# Patient Record
Sex: Male | Born: 1990 | Race: White | Hispanic: No | Marital: Single | Smoking: Current every day smoker
Health system: Southern US, Community
[De-identification: ages and names within clinical notes are randomized; demographics above are authoritative.]

## PROBLEM LIST (undated history)

## (undated) DIAGNOSIS — F32A Depression, unspecified: Secondary | ICD-10-CM

## (undated) DIAGNOSIS — T7840XA Allergy, unspecified, initial encounter: Secondary | ICD-10-CM

## (undated) DIAGNOSIS — F419 Anxiety disorder, unspecified: Secondary | ICD-10-CM

## (undated) DIAGNOSIS — F329 Major depressive disorder, single episode, unspecified: Secondary | ICD-10-CM

## (undated) HISTORY — DX: Anxiety disorder, unspecified: F41.9

## (undated) HISTORY — DX: Depression, unspecified: F32.A

## (undated) HISTORY — DX: Allergy, unspecified, initial encounter: T78.40XA

## (undated) HISTORY — DX: Major depressive disorder, single episode, unspecified: F32.9

---
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
## 2012-02-29 ENCOUNTER — Emergency Department: Payer: Self-pay | Admitting: Emergency Medicine

## 2012-02-29 LAB — BASIC METABOLIC PANEL
Anion Gap: 10 (ref 7–16)
BUN: 16 mg/dL (ref 9–21)
Creatinine: 0.88 mg/dL (ref 0.60–1.30)
EGFR (African American): >60
EGFR (Non-African Amer.): >60
Sodium: 140 mmol/L (ref 132–141)

## 2012-02-29 LAB — URINALYSIS, COMPLETE
Bacteria: NONE SEEN
Bilirubin,UR: NEGATIVE
Blood: NEGATIVE
Glucose,UR: NEGATIVE mg/dL (ref 0–75)
Ketone: NEGATIVE
Leukocyte Esterase: NEGATIVE
Specific Gravity: 1.03 (ref 1.003–1.030)
Squamous Epithelial: NONE SEEN

## 2012-02-29 LAB — CBC
HGB: 15.3 g/dL (ref 13.0–18.0)
MCH: 31.5 pg (ref 26.0–34.0)
MCHC: 34.4 g/dL (ref 32.0–36.0)
Platelet: 237 10*3/uL (ref 150–440)
RBC: 4.86 10*6/uL (ref 4.40–5.90)
RDW: 13.8 % (ref 11.5–14.5)
WBC: 6.9 10*3/uL (ref 3.8–10.6)

## 2012-05-01 ENCOUNTER — Ambulatory Visit: Payer: Self-pay

## 2012-11-15 ENCOUNTER — Emergency Department: Payer: Self-pay | Admitting: Emergency Medicine

## 2014-11-13 ENCOUNTER — Emergency Department: Payer: Self-pay | Admitting: Internal Medicine

## 2014-11-16 ENCOUNTER — Emergency Department: Payer: Self-pay | Admitting: Emergency Medicine

## 2014-11-16 LAB — COMPREHENSIVE METABOLIC PANEL
ALBUMIN: 4.1 g/dL (ref 3.4–5.0)
Alkaline Phosphatase: 112 U/L
Anion Gap: 6 — ABNORMAL LOW (ref 7–16)
BILIRUBIN TOTAL: 0.7 mg/dL (ref 0.2–1.0)
BUN: 7 mg/dL (ref 7–18)
CHLORIDE: 108 mmol/L — AB (ref 98–107)
CO2: 27 mmol/L (ref 21–32)
CREATININE: 0.99 mg/dL (ref 0.60–1.30)
Calcium, Total: 8.6 mg/dL (ref 8.5–10.1)
EGFR (Non-African Amer.): >60
GLUCOSE: 106 mg/dL — AB (ref 65–99)
OSMOLALITY: 280 (ref 275–301)
Potassium: 3.9 mmol/L (ref 3.5–5.1)
SGOT(AST): 14 U/L — ABNORMAL LOW (ref 15–37)
SGPT (ALT): 18 U/L
Sodium: 141 mmol/L (ref 136–145)
Total Protein: 7.6 g/dL (ref 6.4–8.2)

## 2014-11-16 LAB — URINALYSIS, COMPLETE
BACTERIA: NONE SEEN
BLOOD: NEGATIVE
Bilirubin,UR: NEGATIVE
GLUCOSE, UR: NEGATIVE mg/dL (ref 0–75)
Ketone: NEGATIVE
NITRITE: NEGATIVE
PROTEIN: NEGATIVE
Ph: 5 (ref 4.5–8.0)
RBC,UR: 6 /HPF (ref 0–5)
SPECIFIC GRAVITY: 1.016 (ref 1.003–1.030)
Squamous Epithelial: NONE SEEN
WBC UR: 48 /HPF (ref 0–5)

## 2014-11-16 LAB — CBC WITH DIFFERENTIAL/PLATELET
Basophil #: 0.1 10*3/uL (ref 0.0–0.1)
Basophil %: 0.7 %
EOS ABS: 0.3 10*3/uL (ref 0.0–0.7)
EOS PCT: 2.9 %
HCT: 46.2 % (ref 40.0–52.0)
HGB: 15.6 g/dL (ref 13.0–18.0)
LYMPHS PCT: 24 %
Lymphocyte #: 2.2 10*3/uL (ref 1.0–3.6)
MCH: 30.4 pg (ref 26.0–34.0)
MCHC: 33.8 g/dL (ref 32.0–36.0)
MCV: 90 fL (ref 80–100)
MONO ABS: 0.8 x10 3/mm (ref 0.2–1.0)
MONOS PCT: 8.5 %
NEUTROS ABS: 5.8 10*3/uL (ref 1.4–6.5)
NEUTROS PCT: 63.9 %
PLATELETS: 240 10*3/uL (ref 150–440)
RBC: 5.14 10*6/uL (ref 4.40–5.90)
RDW: 13.3 % (ref 11.5–14.5)
WBC: 9.1 10*3/uL (ref 3.8–10.6)

## 2014-11-18 ENCOUNTER — Emergency Department: Payer: Self-pay | Admitting: Emergency Medicine

## 2016-01-03 ENCOUNTER — Emergency Department
Admission: EM | Admit: 2016-01-03 | Discharge: 2016-01-03 | Discharge status: Against Medical Advice | Payer: Medicaid Other | Attending: Emergency Medicine | Admitting: Emergency Medicine

## 2016-01-03 ENCOUNTER — Encounter: Payer: Self-pay | Admitting: Emergency Medicine

## 2016-01-03 DIAGNOSIS — F17200 Nicotine dependence, unspecified, uncomplicated: Secondary | ICD-10-CM | POA: Insufficient documentation

## 2016-01-03 DIAGNOSIS — R0981 Nasal congestion: Secondary | ICD-10-CM | POA: Insufficient documentation

## 2016-01-03 DIAGNOSIS — R51 Headache: Secondary | ICD-10-CM | POA: Insufficient documentation

## 2016-01-03 DIAGNOSIS — R05 Cough: Secondary | ICD-10-CM | POA: Insufficient documentation

## 2016-01-03 DIAGNOSIS — R110 Nausea: Secondary | ICD-10-CM | POA: Insufficient documentation

## 2016-01-03 NOTE — ED Notes (Signed)
Pt to ed with c/o cough, congestion and headache since yesterday.  Pt coughing at triage.

## 2016-01-16 ENCOUNTER — Emergency Department
Admission: EM | Admit: 2016-01-16 | Discharge: 2016-01-16 | Disposition: A | Discharge status: Home / Self Care | Payer: Self-pay | Attending: Emergency Medicine | Admitting: Emergency Medicine

## 2016-01-16 ENCOUNTER — Encounter: Payer: Self-pay | Admitting: Emergency Medicine

## 2016-01-16 ENCOUNTER — Emergency Department: Payer: Medicaid Other

## 2016-01-16 DIAGNOSIS — R0789 Other chest pain: Secondary | ICD-10-CM | POA: Insufficient documentation

## 2016-01-16 DIAGNOSIS — F419 Anxiety disorder, unspecified: Secondary | ICD-10-CM | POA: Insufficient documentation

## 2016-01-16 DIAGNOSIS — F17210 Nicotine dependence, cigarettes, uncomplicated: Secondary | ICD-10-CM | POA: Insufficient documentation

## 2016-01-16 LAB — BASIC METABOLIC PANEL
ANION GAP: 8 (ref 5–15)
BUN: 10 mg/dL (ref 6–20)
CHLORIDE: 106 mmol/L (ref 101–111)
CO2: 25 mmol/L (ref 22–32)
Calcium: 9.3 mg/dL (ref 8.9–10.3)
Creatinine, Ser: 0.91 mg/dL (ref 0.61–1.24)
GFR calc non Af Amer: >60 mL/min (ref >60)
Glucose, Bld: 96 mg/dL (ref 65–99)
POTASSIUM: 3.6 mmol/L (ref 3.5–5.1)
Sodium: 139 mmol/L (ref 135–145)

## 2016-01-16 LAB — CBC
HEMATOCRIT: 44.8 % (ref 40.0–52.0)
HEMOGLOBIN: 15.6 g/dL (ref 13.0–18.0)
MCH: 30.4 pg (ref 26.0–34.0)
MCHC: 34.8 g/dL (ref 32.0–36.0)
MCV: 87.3 fL (ref 80.0–100.0)
PLATELETS: 240 10*3/uL (ref 150–440)
RBC: 5.13 MIL/uL (ref 4.40–5.90)
RDW: 13.1 % (ref 11.5–14.5)
WBC: 9 10*3/uL (ref 3.8–10.6)

## 2016-01-16 LAB — TROPONIN I: Troponin I: <0.03 ng/mL (ref <0.031)

## 2016-01-16 NOTE — ED Provider Notes (Signed)
[facility] Emergency Department Provider Note  ____________________________________________  Time seen: 1440  I have reviewed the triage vital signs and the nursing notes.  History by:  Patient along with his family.  HISTORY  Chief Complaint Chest Pain and Cough     HPI Brett Rice is a 25 y.o. male who reports she has focal pain in his left lower chest. This is been present for a proximally 4 days. He says it is intermittent but there is no pattern to when the pain strikes. He does report he is able to sleep at night and when he awakens is not too bad but then worsens at around noon. He does have some pain with movement. He reports a feeling of tightness across his lower chest as well. He also reports that he feels like the muscles in his legs and arms spasm and her tight at times.  Patient does report he has had similar symptoms before and he thinks she was here in 2015. He reports he has a history of anxiety. Apparently his mother thinks that anxiety is either the cause or contributes to this problem. The patient does not have a counselor or psychiatrist at this time.   History reviewed. No pertinent past medical history.  There are no active problems to display for this patient.   History reviewed. No pertinent past surgical history.  No current outpatient prescriptions on file.  Allergies Review of patient's allergies indicates no known allergies.  History reviewed. No pertinent family history.  Social History Social History  Substance Use Topics  . Smoking status: Current Every Day Smoker -- 2.00 packs/day    Types: Cigarettes  . Smokeless tobacco: None  . Alcohol Use: Yes    Review of Systems  Constitutional: Negative for fever/chills. ENT: Negative for congestion. Cardiovascular: Positive for left-sided chest pain. See history of present illness Respiratory: Negative for cough. Gastrointestinal: Negative for abdominal pain,  vomiting and diarrhea. Genitourinary: Negative for dysuria. Musculoskeletal: Patient reports general muscle tightness and some mild spasms in arms and legs. Skin: Negative for rash. Neurological: Negative for headache or focal weakness Psychiatric: History of anxiety.  10-point ROS otherwise negative.  ____________________________________________   PHYSICAL EXAM:  VITAL SIGNS: ED Triage Vitals  Enc Vitals Group     BP 01/16/16 1352 126/80 mmHg     Pulse Rate 01/16/16 1352 91     Resp 01/16/16 1352 18     Temp 01/16/16 1352 98.1 F (36.7 C)     Temp Source 01/16/16 1352 Oral     SpO2 01/16/16 1352 100 %     Weight 01/16/16 1352 165 lb (74.844 kg)     Height 01/16/16 1352  (1.778 m)     Head Cir --      Peak Flow --      Pain Score 01/16/16 1352 5     Pain Loc --      Pain Edu? --      Excl. in GC? --     Constitutional: Alert and oriented. Well appearing and in no distress. ENT   Head: Normocephalic and atraumatic.   Nose: No congestion/rhinnorhea.  Cardiovascular: Normal rate, regular rhythm, no murmur noted Chest wall: Focal tenderness in the left lower ribs. Respiratory:  Normal respiratory effort, no tachypnea.    Breath sounds are clear and equal bilaterally.  Gastrointestinal: Soft, no distention. Nontender Back: No muscle spasm, no tenderness, no CVA tenderness. Musculoskeletal: No deformity noted. Nontender with normal range of  motion in all extremities.  No noted edema. Neurologic:  Communicative. Normal appearing spontaneous movement in all 4 extremities. No gross focal neurologic deficits are appreciated.  Skin:  Skin is warm, dry. No rash noted. Psychiatric: Mood and affect are normal. Speech and behavior are normal. Patient does report a history and a problem with anxiety. ____________________________________________    LABS (pertinent positives/negatives)  Labs Reviewed  BASIC METABOLIC PANEL  CBC  TROPONIN I      ____________________________________________   EKG  ED ECG REPORT I, Le Ferraz W, the attending physician, personally viewed and interpreted this ECG.   Date: 01/16/2016  EKG Time: 1347  Rate: 80  Rhythm:  Normal sinus rhythm  Axis: Normal  Intervals: Normal  ST&T Change: None noted   ____________________________________________    RADIOLOGY  Chest x-ray: Mild hyper inflation. No infiltrate or acute findings.  ____________________________________________   PROCEDURES    ____________________________________________   INITIAL IMPRESSION / ASSESSMENT AND PLAN / ED COURSE  Pertinent labs & imaging results that were available during my care of the patient were reviewed by me and considered in my medical decision making (see chart for details).  25 year old male without history of cardiac disease or restaurant problems, with focal tenderness in his left chest wall.  I do not see any benefit of stronger medications for him, including muscle relaxants. The family member in the room with the patient agrees. I will avoid benzodiazepines currently for this patient. I have suggested that he take 600 mg of ibuprofen 3 times a day and to supplement with magnesium to possibly ease some of the muscle tension. I've asked him to follow-up at [facility] for his anxiety problem and I am referring him to an outpatient physician for further evaluation of this focal chest wall tenderness.  ____________________________________________   FINAL CLINICAL IMPRESSION(S) / ED DIAGNOSES  Final diagnoses:  Chest wall pain      Darien Ramus, MD 01/16/16 1530

## 2016-01-16 NOTE — Discharge Instructions (Signed)
Take ibuprofen, 600-800 mg 3 times a day. You may supplement with magnesium, take 250-500 mg of magnesium before bed. Increased her water and decrease amount of caffeine you taken. Follow-up at [facility] for anxiety issues and at [facility] clinic for any ongoing medical issues. Return to the emergency department if you have worsening chest pain, shortness of breath or other urgent concerns.  Chest Wall Pain Chest wall pain is pain in or around the bones and muscles of your chest. Sometimes, an injury causes this pain. Sometimes, the cause may not be known. This pain may take several weeks or longer to get better. HOME CARE INSTRUCTIONS  Pay attention to any changes in your symptoms. Take these actions to help with your pain:   Rest as told by your health care provider.   Avoid activities that cause pain. These include any activities that use your chest muscles or your abdominal and side muscles to lift heavy items.   If directed, apply ice to the painful area:  Put ice in a plastic bag.  Place a towel between your skin and the bag.  Leave the ice on for 20 minutes, 2-3 times per day.  Take over-the-counter and prescription medicines only as told by your health care provider.  Do not use tobacco products, including cigarettes, chewing tobacco, and e-cigarettes. If you need help quitting, ask your health care provider.  Keep all follow-up visits as told by your health care provider. This is important. SEEK MEDICAL CARE IF:  You have a fever.  Your chest pain becomes worse.  You have new symptoms. SEEK IMMEDIATE MEDICAL CARE IF:  You have nausea or vomiting.  You feel sweaty or light-headed.  You have a cough with phlegm (sputum) or you cough up blood.  You develop shortness of breath.   This information is not intended to replace advice given to you by your health care provider. Make sure you discuss any questions you have with your health care provider.   Document Released:  12/11/2005 Document Revised: 09/01/2015 Document Reviewed: 03/08/2015 Elsevier Interactive Patient Education [employer]

## 2016-01-16 NOTE — ED Notes (Signed)
Pt c/o intermittent chest pain for the last 4 days. Pt states hx of anxiety. States pain is in the L side of his chest and "sometimes" goes to his arm. NAD Noted at this time. Pt alert and oriented and ambulatory in triage.

## 2016-02-02 ENCOUNTER — Emergency Department
Admission: EM | Admit: 2016-02-02 | Discharge: 2016-02-02 | Disposition: A | Discharge status: Home / Self Care | Payer: Medicaid Other | Attending: Emergency Medicine | Admitting: Emergency Medicine

## 2016-02-02 DIAGNOSIS — R112 Nausea with vomiting, unspecified: Secondary | ICD-10-CM

## 2016-02-02 DIAGNOSIS — R1012 Left upper quadrant pain: Secondary | ICD-10-CM | POA: Insufficient documentation

## 2016-02-02 DIAGNOSIS — F17210 Nicotine dependence, cigarettes, uncomplicated: Secondary | ICD-10-CM | POA: Insufficient documentation

## 2016-02-02 DIAGNOSIS — R197 Diarrhea, unspecified: Secondary | ICD-10-CM | POA: Insufficient documentation

## 2016-02-02 LAB — URINALYSIS COMPLETE WITH MICROSCOPIC (ARMC ONLY)
BACTERIA UA: NONE SEEN
Bilirubin Urine: NEGATIVE
GLUCOSE, UA: NEGATIVE mg/dL
HGB URINE DIPSTICK: NEGATIVE
Ketones, ur: NEGATIVE mg/dL
LEUKOCYTES UA: NEGATIVE
Nitrite: NEGATIVE
PH: 6 (ref 5.0–8.0)
Protein, ur: NEGATIVE mg/dL
RBC / HPF: NONE SEEN RBC/hpf (ref 0–5)
SQUAMOUS EPITHELIAL / LPF: NONE SEEN
Specific Gravity, Urine: 1.015 (ref 1.005–1.030)

## 2016-02-02 LAB — COMPREHENSIVE METABOLIC PANEL
ALK PHOS: 82 U/L (ref 38–126)
ALT: 17 U/L (ref 17–63)
ANION GAP: 8 (ref 5–15)
AST: 17 U/L (ref 15–41)
Albumin: 4.4 g/dL (ref 3.5–5.0)
BILIRUBIN TOTAL: 0.7 mg/dL (ref 0.3–1.2)
BUN: 13 mg/dL (ref 6–20)
CALCIUM: 9.2 mg/dL (ref 8.9–10.3)
CO2: 24 mmol/L (ref 22–32)
CREATININE: 0.91 mg/dL (ref 0.61–1.24)
Chloride: 107 mmol/L (ref 101–111)
Glucose, Bld: 136 mg/dL — ABNORMAL HIGH (ref 65–99)
Potassium: 3.5 mmol/L (ref 3.5–5.1)
Sodium: 139 mmol/L (ref 135–145)
TOTAL PROTEIN: 7 g/dL (ref 6.5–8.1)

## 2016-02-02 LAB — CBC
HCT: 40.3 % (ref 40.0–52.0)
HEMOGLOBIN: 14.2 g/dL (ref 13.0–18.0)
MCH: 30.9 pg (ref 26.0–34.0)
MCHC: 35.3 g/dL (ref 32.0–36.0)
MCV: 87.7 fL (ref 80.0–100.0)
Platelets: 220 10*3/uL (ref 150–440)
RBC: 4.6 MIL/uL (ref 4.40–5.90)
RDW: 12.9 % (ref 11.5–14.5)
WBC: 9.5 10*3/uL (ref 3.8–10.6)

## 2016-02-02 LAB — LIPASE, BLOOD: Lipase: 22 U/L (ref 11–51)

## 2016-02-02 MED ORDER — FAMOTIDINE 40 MG PO TABS: 40.0000 mg | ORAL_TABLET | Freq: Every evening | ORAL | Status: DC

## 2016-02-02 MED ORDER — METOCLOPRAMIDE HCL 10 MG PO TABS: 10.0000 mg | ORAL_TABLET | Freq: Four times a day (QID) | ORAL | Status: DC | PRN

## 2016-02-02 NOTE — ED Notes (Signed)
Pt in with co n.v.d since today, also co abd pain.

## 2016-02-02 NOTE — ED Provider Notes (Signed)
[facility] Emergency Department Provider Note  ____________________________________________  Time seen: Approximately 9 PM  I have reviewed the triage vital signs and the nursing notes.   HISTORY  Chief Complaint Emesis    HPI Brett Rice is a 25 y.o. male presenting to the emergency department with nausea vomiting and diarrhea. He says that the symptoms started just today. However, he says he has been having some general malaise over the past week with night sweats. He has a known sick contact of one of his coworkers last week. He says his symptoms have improved and he is now very hungry. He says that he also has cramping abdominal pain in the left upper quadrant of his abdomen. He also has some mild body aches. No report of fever. No burning with urination. Says that he has had 5-6 episode of nausea vomiting and diarrhea. Did not notice any blood in his vomitus or stool.   No past medical history on file.  There are no active problems to display for this patient.   No past surgical history on file.  No current outpatient prescriptions on file.  Allergies Review of patient's allergies indicates no known allergies.  No family history on file.  Social History Social History  Substance Use Topics  . Smoking status: Current Every Day Smoker -- 2.00 packs/day    Types: Cigarettes  . Smokeless tobacco: Not on file  . Alcohol Use: Yes    Review of Systems Constitutional: No fever/chills Eyes: No visual changes. ENT: No sore throat. Cardiovascular: Denies chest pain. Respiratory: Denies shortness of breath. Gastrointestinal:  No constipation. Genitourinary: Negative for dysuria. Musculoskeletal: Negative for back pain. Skin: Negative for rash. Neurological: Negative for headaches, focal weakness or numbness.  10-point ROS otherwise negative.  ____________________________________________   PHYSICAL EXAM:  VITAL SIGNS: ED Triage Vitals   Enc Vitals Group     BP 02/02/16 1932 114/72 mmHg     Pulse Rate 02/02/16 1932 87     Resp 02/02/16 1932 18     Temp 02/02/16 1932 98.5 F (36.9 C)     Temp Source 02/02/16 1932 Oral     SpO2 02/02/16 1932 97 %     Weight 02/02/16 1932 165 lb (74.844 kg)     Height 02/02/16 1932  (1.753 m)     Head Cir --      Peak Flow --      Pain Score 02/02/16 1932 8     Pain Loc --      Pain Edu? --      Excl. in GC? --    Constitutional: Alert and oriented. Well appearing and in no acute distress. Eyes: Conjunctivae are normal. PERRL. EOMI. Head: Atraumatic. Nose: No congestion/rhinnorhea. Mouth/Throat: Mucous membranes are moist.  Oropharynx non-erythematous. Neck: No stridor.   Cardiovascular: Normal rate, regular rhythm. Grossly normal heart sounds.  Good peripheral circulation. Respiratory: Normal respiratory effort.  No retractions. Lungs CTAB. Gastrointestinal: Soft with mild left upper quadrant tenderness palpation. There is no rebound or guarding. No distention.  No CVA tenderness. Musculoskeletal: No lower extremity tenderness nor edema.  No joint effusions. Neurologic:  Normal speech and language. No gross focal neurologic deficits are appreciated. No gait instability. Skin:  Skin is warm, dry and intact. No rash noted. Psychiatric: Mood and affect are normal. Speech and behavior are normal.  ____________________________________________   LABS (all labs ordered are listed, but only abnormal results are displayed)  Labs Reviewed  COMPREHENSIVE METABOLIC PANEL -  Abnormal; Notable for the following:    Glucose, Bld 136 (*)    All other components within normal limits  URINALYSIS COMPLETEWITH MICROSCOPIC ([facility] ONLY) - Abnormal; Notable for the following:    Color, Urine YELLOW (*)    APPearance CLEAR (*)    All other components within normal limits  LIPASE, BLOOD  CBC    ____________________________________________  EKG   ____________________________________________  RADIOLOGY   ____________________________________________   PROCEDURES    ____________________________________________   INITIAL IMPRESSION / ASSESSMENT AND PLAN / ED COURSE  Pertinent labs & imaging results that were available during my care of the patient were reviewed by me and considered in my medical decision making (see chart for details).  Patient with very benign abdominal exam. Now wanting to eat. We'll discharge with antacid as well as Reglan. Has follow-up information for the [place] [facility]. He does not have insurance. We'll be prescribing medicines off the $4 list. Patient also requesting work note. Likely viral etiology with gastritis. Known sick contacts. No right lower quadrant tenderness palpation or rigidity. Malaise and night sweats likely secondary to viral illness. ____________________________________________   FINAL CLINICAL IMPRESSION(S) / ED DIAGNOSES  Left upper quadrant abdominal pain. Nausea vomiting and diarrhea.    Myrna Blazer, MD 02/02/16 2120

## 2016-02-02 NOTE — Discharge Instructions (Signed)
Abdominal Pain, Adult Many things can cause belly (abdominal) pain. Most times, the belly pain is not dangerous. Many cases of belly pain can be watched and treated at home. HOME CARE   Do not take medicines that help you go poop (laxatives) unless told to by your doctor.  Only take medicine as told by your doctor.  Eat or drink as told by your doctor. Your doctor will tell you if you should be on a special diet. GET HELP IF:  You do not know what is causing your belly pain.  You have belly pain while you are sick to your stomach (nauseous) or have runny poop (diarrhea).  You have pain while you pee or poop.  Your belly pain wakes you up at night.  You have belly pain that gets worse or better when you eat.  You have belly pain that gets worse when you eat fatty foods.  You have a fever. GET HELP RIGHT AWAY IF:   The pain does not go away within 2 hours.  You keep throwing up (vomiting).  The pain changes and is only in the right or left part of the belly.  You have bloody or tarry looking poop. MAKE SURE YOU:   Understand these instructions.  Will watch your condition.  Will get help right away if you are not doing well or get worse.   This information is not intended to replace advice given to you by your health care provider. Make sure you discuss any questions you have with your health care provider.   Document Released: 05/29/2008 Document Revised: 01/01/2015 Document Reviewed: 08/20/2013 Elsevier Interactive Patient Education 2016 [place].  Diarrhea Diarrhea is frequent loose and watery bowel movements. It can cause you to feel weak and dehydrated. Dehydration can cause you to become tired and thirsty, have a dry mouth, and have decreased urination that often is dark yellow. Diarrhea is a sign of another problem, most often an infection that will not last long. In most cases, diarrhea typically lasts 2-3 days. However, it can last longer if it is a sign of  something more serious. It is important to treat your diarrhea as directed by your caregiver to lessen or prevent future episodes of diarrhea. CAUSES  Some common causes include:  Gastrointestinal infections caused by viruses, bacteria, or parasites.  Food poisoning or food allergies.  Certain medicines, such as antibiotics, chemotherapy, and laxatives.  Artificial sweeteners and fructose.  Digestive disorders. HOME CARE INSTRUCTIONS  Ensure adequate fluid intake (hydration): Have 1 cup (8 oz) of fluid for each diarrhea episode. Avoid fluids that contain simple sugars or sports drinks, fruit juices, whole milk products, and sodas. Your urine should be clear or pale yellow if you are drinking enough fluids. Hydrate with an oral rehydration solution that you can purchase at pharmacies, retail stores, and online. You can prepare an oral rehydration solution at home by mixing the following ingredients together:   - tsp table salt.   tsp baking soda.   tsp salt substitute containing potassium chloride.  1  tablespoons sugar.  1 L (34 oz) of water.  Certain foods and beverages may increase the speed at which food moves through the gastrointestinal (GI) tract. These foods and beverages should be avoided and include:  Caffeinated and alcoholic beverages.  High-fiber foods, such as raw fruits and vegetables, nuts, seeds, and whole grain breads and cereals.  Foods and beverages sweetened with sugar alcohols, such as xylitol, sorbitol, and mannitol.  Some  foods may be well tolerated and may help thicken stool including:  Starchy foods, such as rice, toast, pasta, low-sugar cereal, oatmeal, grits, baked potatoes, crackers, and bagels.  Bananas.  Applesauce.  Add probiotic-rich foods to help increase healthy bacteria in the GI tract, such as yogurt and fermented milk products.  Wash your hands well after each diarrhea episode.  Only take over-the-counter or prescription medicines  as directed by your caregiver.  Take a warm bath to relieve any burning or pain from frequent diarrhea episodes. SEEK IMMEDIATE MEDICAL CARE IF:   You are unable to keep fluids down.  You have persistent vomiting.  You have blood in your stool, or your stools are black and tarry.  You do not urinate in 6-8 hours, or there is only a small amount of very dark urine.  You have abdominal pain that increases or localizes.  You have weakness, dizziness, confusion, or light-headedness.  You have a severe headache.  Your diarrhea gets worse or does not get better.  You have a fever or persistent symptoms for more than 2-3 days.  You have a fever and your symptoms suddenly get worse. MAKE SURE YOU:   Understand these instructions.  Will watch your condition.  Will get help right away if you are not doing well or get worse.   This information is not intended to replace advice given to you by your health care provider. Make sure you discuss any questions you have with your health care provider.   Document Released: 12/01/2002 Document Revised: 01/01/2015 Document Reviewed: 08/18/2012 Elsevier Interactive Patient Education 2016 [employer]  Nausea and Vomiting Nausea is a sick feeling that often comes before throwing up (vomiting). Vomiting is a reflex where stomach contents come out of your mouth. Vomiting can cause severe loss of body fluids (dehydration). Children and elderly adults can become dehydrated quickly, especially if they also have diarrhea. Nausea and vomiting are symptoms of a condition or disease. It is important to find the cause of your symptoms. CAUSES   Direct irritation of the stomach lining. This irritation can result from increased acid production (gastroesophageal reflux disease), infection, food poisoning, taking certain medicines (such as nonsteroidal anti-inflammatory drugs), alcohol use, or tobacco use.  Signals from the brain.These signals could be  caused by a headache, heat exposure, an inner ear disturbance, increased pressure in the brain from injury, infection, a tumor, or a concussion, pain, emotional stimulus, or metabolic problems.  An obstruction in the gastrointestinal tract (bowel obstruction).  Illnesses such as diabetes, hepatitis, gallbladder problems, appendicitis, kidney problems, cancer, sepsis, atypical symptoms of a heart attack, or eating disorders.  Medical treatments such as chemotherapy and radiation.  Receiving medicine that makes you sleep (general anesthetic) during surgery. DIAGNOSIS Your caregiver may ask for tests to be done if the problems do not improve after a few days. Tests may also be done if symptoms are severe or if the reason for the nausea and vomiting is not clear. Tests may include:  Urine tests.  Blood tests.  Stool tests.  Cultures (to look for evidence of infection).  X-rays or other imaging studies. Test results can help your caregiver make decisions about treatment or the need for additional tests. TREATMENT You need to stay well hydrated. Drink frequently but in small amounts.You may wish to drink water, sports drinks, clear broth, or eat frozen ice pops or gelatin dessert to help stay hydrated.When you eat, eating slowly may help prevent nausea.There are also some antinausea medicines  that may help prevent nausea. HOME CARE INSTRUCTIONS   Take all medicine as directed by your caregiver.  If you do not have an appetite, do not force yourself to eat. However, you must continue to drink fluids.  If you have an appetite, eat a normal diet unless your caregiver tells you differently.  Eat a variety of complex carbohydrates (rice, wheat, potatoes, bread), lean meats, yogurt, fruits, and vegetables.  Avoid high-fat foods because they are more difficult to digest.  Drink enough water and fluids to keep your urine clear or pale yellow.  If you are dehydrated, ask your caregiver for  specific rehydration instructions. Signs of dehydration may include:  Severe thirst.  Dry lips and mouth.  Dizziness.  Dark urine.  Decreasing urine frequency and amount.  Confusion.  Rapid breathing or pulse. SEEK IMMEDIATE MEDICAL CARE IF:   You have blood or brown flecks (like coffee grounds) in your vomit.  You have black or bloody stools.  You have a severe headache or stiff neck.  You are confused.  You have severe abdominal pain.  You have chest pain or trouble breathing.  You do not urinate at least once every 8 hours.  You develop cold or clammy skin.  You continue to vomit for longer than 24 to 48 hours.  You have a fever. MAKE SURE YOU:   Understand these instructions.  Will watch your condition.  Will get help right away if you are not doing well or get worse.   This information is not intended to replace advice given to you by your health care provider. Make sure you discuss any questions you have with your health care provider.   Document Released: 12/11/2005 Document Revised: 03/04/2012 Document Reviewed: 05/10/2011 Elsevier Interactive Patient Education [employer]

## 2016-03-16 ENCOUNTER — Emergency Department: Payer: Self-pay

## 2016-03-16 ENCOUNTER — Emergency Department
Admission: EM | Admit: 2016-03-16 | Discharge: 2016-03-16 | Disposition: A | Discharge status: Home / Self Care | Payer: Self-pay | Attending: Emergency Medicine | Admitting: Emergency Medicine

## 2016-03-16 DIAGNOSIS — J069 Acute upper respiratory infection, unspecified: Secondary | ICD-10-CM | POA: Insufficient documentation

## 2016-03-16 DIAGNOSIS — B349 Viral infection, unspecified: Secondary | ICD-10-CM | POA: Insufficient documentation

## 2016-03-16 DIAGNOSIS — F17210 Nicotine dependence, cigarettes, uncomplicated: Secondary | ICD-10-CM | POA: Insufficient documentation

## 2016-03-16 LAB — RAPID INFLUENZA A&B ANTIGENS (ARMC ONLY)
INFLUENZA A (ARMC): NEGATIVE
INFLUENZA B (ARMC): NEGATIVE

## 2016-03-16 MED ORDER — IBUPROFEN 800 MG PO TABS: 800.0000 mg | ORAL_TABLET | Freq: Three times a day (TID) | ORAL | Status: DC | PRN

## 2016-03-16 MED ORDER — PSEUDOEPH-BROMPHEN-DM 30-2-10 MG/5ML PO SYRP: 5.0000 mL | ORAL_SOLUTION | Freq: Four times a day (QID) | ORAL | Status: DC | PRN

## 2016-03-16 MED ORDER — BENZONATATE 100 MG PO CAPS
200.0000 mg | ORAL_CAPSULE | Freq: Once | ORAL | Status: AC
  Administered 2016-03-16: 200 mg via ORAL
  Filled 2016-03-16: qty 2

## 2016-03-16 MED ORDER — IBUPROFEN 800 MG PO TABS
800.0000 mg | ORAL_TABLET | Freq: Once | ORAL | Status: AC
  Administered 2016-03-16: 800 mg via ORAL
  Filled 2016-03-16: qty 1

## 2016-03-16 NOTE — ED Provider Notes (Signed)
[facility] Emergency Department Provider Note  ____________________________________________  Time seen: Approximately 2:47 PM  I have reviewed the triage vital signs and the nursing notes.   HISTORY  Chief Complaint URI    HPI Brett Rice is a 25 y.o. male patient complaining of congestion and fever for one week. Patient also states she's had vomiting and diarrhea. Patient state can tolerate fluids but had trouble with solid foods causing  more diarrhea. Patient complaining of chest wall pain secondary to prolonged coughing. Patient state no flu shot this season. State whole household is sick. No palliative measures for his complaint.   History reviewed. No pertinent past medical history.  There are no active problems to display for this patient.   History reviewed. No pertinent past surgical history.  Current Outpatient Rx  Name  Route  Sig  Dispense  Refill  . brompheniramine-pseudoephedrine-DM 30-2-10 MG/5ML syrup   Oral   Take 5 mLs by mouth 4 (four) times daily as needed.   120 mL   0   . famotidine (PEPCID) 40 MG tablet   Oral   Take 1 tablet (40 mg total) by mouth every evening.   15 tablet   0   . ibuprofen (ADVIL,MOTRIN) 800 MG tablet   Oral   Take 1 tablet (800 mg total) by mouth every 8 (eight) hours as needed.   30 tablet   0   . metoCLOPramide (REGLAN) 10 MG tablet   Oral   Take 1 tablet (10 mg total) by mouth every 6 (six) hours as needed for nausea or vomiting.   12 tablet   0     Allergies Review of patient's allergies indicates no known allergies.  No family history on file.  Social History Social History  Substance Use Topics  . Smoking status: Current Every Day Smoker -- 2.00 packs/day    Types: Cigarettes  . Smokeless tobacco: None  . Alcohol Use: Yes    Review of Systems Constitutional: Fever chills and body aches Eyes: No visual changes. ENT: No sore throat. Cardiovascular: Denies chest  pain. Respiratory: Denies shortness of breath. Nonproductive cough Gastrointestinal: No abdominal pain. Nausea, vomiting diarrhea.  No constipation. Genitourinary: Negative for dysuria. Musculoskeletal: Negative for back pain. Skin: Negative for rash. Neurological: Positive for headaches, denies focal weakness or numbness.    ____________________________________________   PHYSICAL EXAM:  VITAL SIGNS: ED Triage Vitals  Enc Vitals Group     BP 03/16/16 1412 116/72 mmHg     Pulse Rate 03/16/16 1412 106     Resp 03/16/16 1412 20     Temp 03/16/16 1412 100.1 F (37.8 C)     Temp src --      SpO2 03/16/16 1412 99 %     Weight 03/16/16 1412 160 lb (72.576 kg)     Height 03/16/16 1412 5\' 11"  (1.803 m)     Head Cir --      Peak Flow --      Pain Score --      Pain Loc --      Pain Edu? --      Excl. in GC? --     Constitutional: Alert and oriented. Well appearing and in no acute distress. Fever Eyes: Conjunctivae are normal. PERRL. EOMI. Head: Atraumatic. Nose: Edematous nasal turbinates clear rhinorrhea  Mouth/Throat: Mucous membranes are moist.  Oropharynx non-erythematous. Neck: No stridor.  No cervical spine tenderness to palpation. Hematological/Lymphatic/Immunilogical: No cervical lymphadenopathy. Cardiovascular: Normal rate, regular rhythm. Grossly  normal heart sounds.  Good peripheral circulation. Respiratory: Normal respiratory effort.  No retractions. Lungs Rales and nonproductive cough Gastrointestinal: Soft and nontender. No distention. No abdominal bruits. No CVA tenderness. Musculoskeletal: No lower extremity tenderness nor edema.  No joint effusions. Neurologic:  Normal speech and language. No gross focal neurologic deficits are appreciated. No gait instability. Skin:  Skin is warm, dry and intact. No rash noted. Psychiatric: Mood and affect are normal. Speech and behavior are normal.  ____________________________________________   LABS (all labs ordered are  listed, but only abnormal results are displayed)  Labs Reviewed  RAPID INFLUENZA A&B ANTIGENS ([facility] ONLY)   ____________________________________________  EKG   ____________________________________________  RADIOLOGY  Acute findings on chest x-ray ____________________________________________   PROCEDURES  Procedure(s) performed: None  Critical Care performed: No  ____________________________________________   INITIAL IMPRESSION / ASSESSMENT AND PLAN / ED COURSE  Pertinent labs & imaging results that were available during my care of the patient were reviewed by me and considered in my medical decision making (see chart for details).  Viral illness. Discussed negative chest x-ray and rapid flu results with patient. Patient given discharge care instructions. Patient given a prescription for Bromfed-DM and ibuprofen. Patient [facility]. ____________________________________________   FINAL CLINICAL IMPRESSION(S) / ED DIAGNOSES  Final diagnoses:  Viral illness      Joni Reining, PA-C 03/16/16 1539  Joni Reining, PA-C 03/16/16 1540

## 2016-03-16 NOTE — ED Notes (Signed)
Pt c/o cough with congestion and fever for the past week.

## 2016-03-16 NOTE — Discharge Instructions (Signed)

## 2016-04-14 ENCOUNTER — Emergency Department: Payer: No Typology Code available for payment source

## 2016-04-14 ENCOUNTER — Emergency Department
Admission: EM | Admit: 2016-04-14 | Discharge: 2016-04-14 | Disposition: A | Discharge status: Home / Self Care | Payer: No Typology Code available for payment source | Attending: Emergency Medicine | Admitting: Emergency Medicine

## 2016-04-14 DIAGNOSIS — F1290 Cannabis use, unspecified, uncomplicated: Secondary | ICD-10-CM | POA: Insufficient documentation

## 2016-04-14 DIAGNOSIS — S161XXA Strain of muscle, fascia and tendon at neck level, initial encounter: Secondary | ICD-10-CM | POA: Insufficient documentation

## 2016-04-14 DIAGNOSIS — Y92410 Unspecified street and highway as the place of occurrence of the external cause: Secondary | ICD-10-CM | POA: Insufficient documentation

## 2016-04-14 DIAGNOSIS — Y9389 Activity, other specified: Secondary | ICD-10-CM | POA: Diagnosis not present

## 2016-04-14 DIAGNOSIS — S0003XA Contusion of scalp, initial encounter: Secondary | ICD-10-CM | POA: Diagnosis present

## 2016-04-14 DIAGNOSIS — S8002XA Contusion of left knee, initial encounter: Secondary | ICD-10-CM | POA: Insufficient documentation

## 2016-04-14 DIAGNOSIS — Y999 Unspecified external cause status: Secondary | ICD-10-CM | POA: Insufficient documentation

## 2016-04-14 DIAGNOSIS — F17210 Nicotine dependence, cigarettes, uncomplicated: Secondary | ICD-10-CM | POA: Insufficient documentation

## 2016-04-14 MED ORDER — HYDROCODONE-ACETAMINOPHEN 5-325 MG PO TABS
1.0000 | ORAL_TABLET | Freq: Once | ORAL | Status: AC
  Administered 2016-04-14: 1 via ORAL
  Filled 2016-04-14: qty 1

## 2016-04-14 MED ORDER — IBUPROFEN 600 MG PO TABS
600.0000 mg | ORAL_TABLET | Freq: Once | ORAL | Status: AC
  Administered 2016-04-14: 600 mg via ORAL
  Filled 2016-04-14: qty 1

## 2016-04-14 MED ORDER — HYDROCODONE-ACETAMINOPHEN 5-325 MG PO TABS: 1.0000 | ORAL_TABLET | ORAL | Status: DC | PRN

## 2016-04-14 MED ORDER — IBUPROFEN 600 MG PO TABS: 600.0000 mg | ORAL_TABLET | Freq: Three times a day (TID) | ORAL | Status: DC | PRN
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
## 2016-04-14 NOTE — Discharge Instructions (Signed)
[facility] if any continued problems. Ice or heat to your muscles as needed for comfort. Apply ice to your left knee to reduce swelling and pain. Ibuprofen every 8 hours with food and Norco as needed for severe pain. Be aware that you cannot take Norco and driving as it may cause drowsiness.
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
## 2016-07-12 ENCOUNTER — Emergency Department: Admission: EM | Admit: 2016-07-12 | Discharge: 2016-07-12 | Discharge status: Absent without leave | Payer: Self-pay

## 2016-07-12 ENCOUNTER — Emergency Department
Admission: EM | Admit: 2016-07-12 | Discharge: 2016-07-12 | Disposition: A | Discharge status: Home / Self Care | Payer: No Typology Code available for payment source | Attending: Emergency Medicine | Admitting: Emergency Medicine

## 2016-07-12 ENCOUNTER — Encounter: Payer: Self-pay | Admitting: Emergency Medicine

## 2016-07-12 DIAGNOSIS — Z79899 Other long term (current) drug therapy: Secondary | ICD-10-CM | POA: Insufficient documentation

## 2016-07-12 DIAGNOSIS — F17210 Nicotine dependence, cigarettes, uncomplicated: Secondary | ICD-10-CM | POA: Insufficient documentation

## 2016-07-12 DIAGNOSIS — F1290 Cannabis use, unspecified, uncomplicated: Secondary | ICD-10-CM | POA: Insufficient documentation

## 2016-07-12 DIAGNOSIS — F419 Anxiety disorder, unspecified: Secondary | ICD-10-CM | POA: Insufficient documentation

## 2016-07-12 MED ORDER — CLONAZEPAM 0.5 MG PO TABS: 0.2500 mg | ORAL_TABLET | Freq: Two times a day (BID) | ORAL | Status: DC | PRN

## 2016-07-12 MED ORDER — ALPRAZOLAM 0.25 MG PO TABS: 0.2500 mg | ORAL_TABLET | Freq: Three times a day (TID) | ORAL | Status: DC | PRN

## 2016-07-12 NOTE — Discharge Instructions (Signed)
Please seek medical attention and help for any thoughts about wanting to harm herself, harm others, any concerning change in behavior, severe depression, inappropriate drug use or any other new or concerning symptoms.  Generalized Anxiety Disorder Generalized anxiety disorder (GAD) is a mental disorder. It interferes with life functions, including relationships, work, and school. GAD is different from normal anxiety, which everyone experiences at some point in their lives in response to specific life events and activities. Normal anxiety actually helps [place] prepare for and get through these life events and activities. Normal anxiety goes away after the event or activity is over.  GAD causes anxiety that is not necessarily related to specific events or activities. It also causes excess anxiety in proportion to specific events or activities. The anxiety associated with GAD is also difficult to control. GAD can vary from mild to severe. People with severe GAD can have intense waves of anxiety with physical symptoms (panic attacks).  SYMPTOMS The anxiety and worry associated with GAD are difficult to control. This anxiety and worry are related to many life events and activities and also occur more days than not for 6 months or longer. People with GAD also have three or more of the following symptoms (one or more in children):  Restlessness.   Fatigue.  Difficulty concentrating.   Irritability.  Muscle tension.  Difficulty sleeping or unsatisfying sleep. DIAGNOSIS GAD is diagnosed through an assessment by your health care provider. Your health care provider will ask you questions aboutyour mood,physical symptoms, and events in your life. Your health care provider may ask you about your medical history and use of alcohol or drugs, including prescription medicines. Your health care provider may also do a physical exam and blood tests. Certain medical conditions and the use of certain substances can  cause symptoms similar to those associated with GAD. Your health care provider may refer you to a mental health specialist for further evaluation. TREATMENT The following therapies are usually used to treat GAD:   Medication. Antidepressant medication usually is prescribed for long-term daily control. Antianxiety medicines may be added in severe cases, especially when panic attacks occur.   Talk therapy (psychotherapy). Certain types of talk therapy can be helpful in treating GAD by providing support, education, and guidance. A form of talk therapy called cognitive behavioral therapy can teach you healthy ways to think about and react to daily life events and activities.  Stress managementtechniques. These include yoga, meditation, and exercise and can be very helpful when they are practiced regularly. A mental health specialist can help determine which treatment is best for you. Some people see improvement with one therapy. However, other people require a combination of therapies.   This information is not intended to replace advice given to you by your health care provider. Make sure you discuss any questions you have with your health care provider.   Document Released: 04/07/2013 Document Revised: 01/01/2015 Document Reviewed: 04/07/2013 Elsevier Interactive Patient Education 2016 [employer].  Outpatient Psychiatry and Counseling  [facility]: Mobile Crisis Management 24 hours:  [phone]  [facility] [employer] sliding scale fee and walk in schedule: M-F 8am-12pm/1pm-3pm [address]  [place], [place] [address] [phone]  [facility] [address] [place], [place] [address] [phone]  [facility] ([facility] [employer])- new patient walk-in appointments available Monday - Friday 8am -3pm.          [address] [place], [place] [address] [phone] or crisis [phone]  [facility] Out[facility]/ Intensive Outpatient Therapy Program [address] [place], [place] [address] [phone]  [facility]                  Crisis Services      [phone]      [address]     [place], [place] [address]                 [facility]   [facility] [phone] [address] [place], [place] [address]   [facility]          [address],  [place], [place] [address]       [phone]  [facility] [address] [place], [place] [address] [phone]  [facility]    [address]    [place], [place] [address]     [phone]       Andee PolesParish McKinney, MD     [address]     [place] [address]     [phone]       [facility] [address] [place] [address]  [facility]     [address]     [place], [place]      [phone]       [facility] Eulogio DitchShamsher Ahluwalia, MD [address] [place], [place] [address] [phone]  [facility]     [address]     [place], [place] [address]     [phone]       [facility] [address] [place], [place] [address] [phone] Resources for Temporary Residential Assistance/Crisis Centers

## 2016-07-12 NOTE — ED Notes (Signed)
Pt reports that he has severe anxiety attacks and that he has had this problem for most of his life but it has gotten worse since October (no cause can be identified for the increase in anxiety) - he states that he cannot stand to be alone and that if he awakes and is alone he panics and tries to call his roommate and if he cannot reach him then he panics worse - he talks to his parents but they are mean to him and it makes him sad and makes him have panic attacks - he called [facility] today and they could not see him for several weeks and told him to come to the ER for eval

## 2016-07-12 NOTE — ED Notes (Signed)
Patient to ER for c/o anxiety. States when he sees that no one is home it makes him very anxious. Feels like he is not getting well rested, despite sleeping. Has not been taking anything for anxiety. [facility], was told to come here because it would be 6 weeks before an appointment was available.

## 2016-07-12 NOTE — ED Provider Notes (Signed)
[facility] Emergency Department Provider Note   ____________________________________________  Time seen: ~1950  I have reviewed the triage vital signs and the nursing notes.   HISTORY  Chief Complaint Anxiety   History limited by: Not Limited   HPI Brett Rice is a 25 y.o. male who presents to the emergency department today because of concerns for anxiety. The patient see thinks he has had problems with anxiety. Much his whole life. He was seeing a therapist and [facility] number of years ago and was put on a medication for brief time. Patient is unsure what medication it was. He went to [facility] today to try to reestablish care however they were not able to see him for a number of weeks. They told him to come to the emergency department for further help. The patient denies any SI or HI.   History reviewed. No pertinent past medical history.  There are no active problems to display for this patient.   History reviewed. No pertinent past surgical history.  Current Outpatient Rx  Name  Route  Sig  Dispense  Refill  . brompheniramine-pseudoephedrine-DM 30-2-10 MG/5ML syrup   Oral   Take 5 mLs by mouth 4 (four) times daily as needed.   120 mL   0   . famotidine (PEPCID) 40 MG tablet   Oral   Take 1 tablet (40 mg total) by mouth every evening.   15 tablet   0   . HYDROcodone-acetaminophen (NORCO/VICODIN) 5-325 MG tablet   Oral   Take 1 tablet by mouth every 4 (four) hours as needed for moderate pain.   20 tablet   0   . ibuprofen (ADVIL,MOTRIN) 600 MG tablet   Oral   Take 1 tablet (600 mg total) by mouth every 8 (eight) hours as needed.   30 tablet   0   . metoCLOPramide (REGLAN) 10 MG tablet   Oral   Take 1 tablet (10 mg total) by mouth every 6 (six) hours as needed for nausea or vomiting.   12 tablet   0     Allergies Risperdal  No family history on file.  Social History Social History  Substance Use Topics  . Smoking status: Current  Every Day Smoker -- 2.00 packs/day    Types: Cigarettes  . Smokeless tobacco: None  . Alcohol Use: Yes     Comment: social     Review of Systems  Constitutional: Negative for fever. Cardiovascular: Negative for chest pain. Respiratory: Negative for shortness of breath. Gastrointestinal: Negative for abdominal pain, vomiting and diarrhea. Neurological: Negative for headaches, focal weakness or numbness.   10-point ROS otherwise negative.  ____________________________________________   PHYSICAL EXAM:  VITAL SIGNS: ED Triage Vitals  Enc Vitals Group     BP 07/12/16 1813 125/72 mmHg     Pulse Rate 07/12/16 1813 87     Resp --      Temp 07/12/16 1813 98 F (36.7 C)     Temp Source 07/12/16 1813 Oral     SpO2 07/12/16 1813 98 %     Weight 07/12/16 1813 165 lb (74.844 kg)     Height 07/12/16 1813 5\' 10"  (1.778 m)     Head Cir --      Peak Flow --      Pain Score 07/12/16 1814 0   Constitutional: Alert and oriented. Appears quite anxious Eyes: Conjunctivae are normal. PERRL. Normal extraocular movements. ENT   Head: Normocephalic and atraumatic.   Nose: No congestion/rhinnorhea.  Mouth/Throat: Mucous membranes are moist.   Neck: No stridor. Hematological/Lymphatic/Immunilogical: No cervical lymphadenopathy. Cardiovascular: Normal rate, regular rhythm.  No murmurs, rubs, or gallops. Respiratory: Normal respiratory effort without tachypnea nor retractions. Breath sounds are clear and equal bilaterally. No wheezes/rales/rhonchi. Gastrointestinal: Soft and nontender. No distention. There is no CVA tenderness. Genitourinary: Deferred Musculoskeletal: Normal range of motion in all extremities. No joint effusions.  No lower extremity tenderness nor edema. Neurologic:  Normal speech and language. No gross focal neurologic deficits are appreciated.  Skin:  Skin is warm, dry and intact. No rash noted. Psychiatric: Appears anxious. Denies SI or  HI. ____________________________________________    LABS (pertinent positives/negatives)  None  ____________________________________________   EKG  None  ____________________________________________    RADIOLOGY  None  ____________________________________________   PROCEDURES  Procedure(s) performed: None  Critical Care performed: No  ____________________________________________   INITIAL IMPRESSION / ASSESSMENT AND PLAN / ED COURSE  Pertinent labs & imaging results that were available during my care of the patient were reviewed by me and considered in my medical decision making (see chart for details).  Patient presents to the emergency department today because of concerns for anxiety. No SI or HI. Will give patients short course of benzodiazepines. Will give patient further health care resources.  ____________________________________________   FINAL CLINICAL IMPRESSION(S) / ED DIAGNOSES  Final diagnoses:  Anxiety     Note: This dictation was prepared with Dragon dictation. Any transcriptional errors that result from this process are unintentional    Phineas Semen, MD 07/12/16 2017

## 2017-04-15 ENCOUNTER — Emergency Department
Admission: EM | Admit: 2017-04-15 | Discharge: 2017-04-15 | Disposition: A | Discharge status: Home / Self Care | Payer: Self-pay | Attending: Emergency Medicine | Admitting: Emergency Medicine

## 2017-04-15 DIAGNOSIS — K0889 Other specified disorders of teeth and supporting structures: Secondary | ICD-10-CM

## 2017-04-15 DIAGNOSIS — F17210 Nicotine dependence, cigarettes, uncomplicated: Secondary | ICD-10-CM | POA: Insufficient documentation

## 2017-04-15 DIAGNOSIS — K029 Dental caries, unspecified: Secondary | ICD-10-CM | POA: Insufficient documentation

## 2017-04-15 MED ORDER — AMOXICILLIN 500 MG PO TABS: 500.0000 mg | ORAL_TABLET | Freq: Three times a day (TID) | ORAL | 0 refills | Status: AC

## 2017-04-15 MED ORDER — KETOROLAC TROMETHAMINE 10 MG PO TABS: 10.0000 mg | ORAL_TABLET | Freq: Four times a day (QID) | ORAL | 0 refills | Status: AC | PRN

## 2017-04-15 MED ORDER — KETOROLAC TROMETHAMINE 60 MG/2ML IM SOLN
30.0000 mg | Freq: Once | INTRAMUSCULAR | Status: AC
  Administered 2017-04-15: 30 mg via INTRAMUSCULAR
  Filled 2017-04-15: qty 2

## 2017-04-15 NOTE — ED Triage Notes (Signed)
Patient reports sudden onset of right lower jaw dental pain.

## 2017-04-15 NOTE — ED Provider Notes (Signed)
[facility] Emergency Department Provider Note  ____________________________________________  Time seen: Approximately 9:15 PM  I have reviewed the triage vital signs and the nursing notes.   HISTORY  Chief Complaint Dental Pain    HPI Brett Rice is a 26 y.o. male presenting to the emergency department with inferior 31 pain and right lower jaw pain. Patient states that inferior 31 has been broken for "some time". Patient states that he had fever and chills last night. He has noticed no increased gingival hypertrophy surrounding inferior 31. He has noticed no palpable edema of the upper or lower right jaw. Patient states that he does not currently have an appointment with a local dentist. No alleviating measures have been attempted.   No past medical history on file.  There are no active problems to display for this patient.   No past surgical history on file.  Prior to Admission medications   Medication Sig Start Date End Date Taking? Authorizing Provider  amoxicillin (AMOXIL) 500 MG tablet Take 1 tablet (500 mg total) by mouth 3 (three) times daily. 04/15/17 04/25/17  Orvil Feil, PA-C  brompheniramine-pseudoephedrine-DM 30-2-10 MG/5ML syrup Take 5 mLs by mouth 4 (four) times daily as needed. 03/16/16   Joni Reining, PA-C  clonazePAM (KLONOPIN) 0.5 MG tablet Take 0.5 tablets (0.25 mg total) by mouth 2 (two) times daily as needed for anxiety. 07/12/16   Phineas Semen, MD  famotidine (PEPCID) 40 MG tablet Take 1 tablet (40 mg total) by mouth every evening. 02/02/16 02/01/17  Myrna Blazer, MD  HYDROcodone-acetaminophen (NORCO/VICODIN) 5-325 MG tablet Take 1 tablet by mouth every 4 (four) hours as needed for moderate pain. 04/14/16   Tommi Rumps, PA-C  ibuprofen (ADVIL,MOTRIN) 600 MG tablet Take 1 tablet (600 mg total) by mouth every 8 (eight) hours as needed. 04/14/16   Tommi Rumps, PA-C  ketorolac (TORADOL) 10 MG tablet Take 1 tablet (10  mg total) by mouth every 6 (six) hours as needed. 04/15/17 04/20/17  Orvil Feil, PA-C  metoCLOPramide (REGLAN) 10 MG tablet Take 1 tablet (10 mg total) by mouth every 6 (six) hours as needed for nausea or vomiting. 02/02/16   Myrna Blazer, MD    Allergies Risperdal [risperidone]  No family history on file.  Social History Social History  Substance Use Topics  . Smoking status: Current Every Day Smoker    Packs/day: 2.00    Types: Cigarettes  . Smokeless tobacco: Not on file  . Alcohol use Yes     Comment: social      Review of Systems  Constitutional: Patient has had fever and chills.  Eyes: No visual changes. No discharge ENT: Patient has Inferior 31 pain.  Cardiovascular: no chest pain. Respiratory: no cough. No SOB. Gastrointestinal: No abdominal pain.  No nausea, no vomiting.  No diarrhea.  No constipation. Musculoskeletal: Negative for musculoskeletal pain. Skin: Negative for rash, abrasions, lacerations, ecchymosis. Neurological: Negative for headaches, focal weakness or numbness.  ____________________________________________   PHYSICAL EXAM:  VITAL SIGNS: ED Triage Vitals  Enc Vitals Group     BP 04/15/17 1938 120/77     Pulse Rate 04/15/17 1938 70     Resp 04/15/17 1938 (!) 22     Temp 04/15/17 1938 97.9 F (36.6 C)     Temp Source 04/15/17 1938 Oral     SpO2 04/15/17 1938 98 %     Weight 04/15/17 1939 155 lb (70.3 kg)     Height 04/15/17  1939  (1.778 m)     Head Circumference --      Peak Flow --      Pain Score 04/15/17 2001 10     Pain Loc --      Pain Edu? --      Excl. in GC? --    Constitutional: Alert and oriented. Well appearing and in no acute distress. Eyes: Conjunctivae are normal. PERRL. EOMI. Head: Atraumatic. ENT:      Ears: Tympanic membranes are pearly bilaterally.      Nose: No congestion/rhinnorhea.      Mouth/Throat: Mucous membranes are moist. Inferior 31 is broken with surrounding dental caries. No increased  gingival hypertrophy. Hematological/Lymphatic/Immunilogical: No cervical lymphadenopathy. Cardiovascular: Normal rate, regular rhythm. Normal S1 and S2.  Good peripheral circulation. Respiratory: Normal respiratory effort without tachypnea or retractions. Lungs CTAB. Good air entry to the bases with no decreased or absent breath sounds. Musculoskeletal: Full range of motion to all extremities. No gross deformities appreciated. Neurologic:  Normal speech and language. No gross focal neurologic deficits are appreciated.  Skin:  Skin is warm, dry and intact. No rash noted. Psychiatric: Mood and affect are normal. Speech and behavior are normal. Patient exhibits appropriate insight and judgement.   ____________________________________________   LABS (all labs ordered are listed, but only abnormal results are displayed)  Labs Reviewed - No data to display ____________________________________________  EKG   ____________________________________________  RADIOLOGY   No results found.  ____________________________________________    PROCEDURES  Procedure(s) performed:    Procedures    Medications  ketorolac (TORADOL) injection 30 mg (not administered)     ____________________________________________   INITIAL IMPRESSION / ASSESSMENT AND PLAN / ED COURSE  Pertinent labs & imaging results that were available during my care of the patient were reviewed by me and considered in my medical decision making (see chart for details).  Review of the [place] CSRS was performed in accordance of the [facility] prior to dispensing any controlled drugs.     Assessment and Plan: Dental Pain:  Patient presents to the emergency department with inferior 31 pain. On physical exam, Inferior 31 is broken with dental caries visualized. Patient has also had fever for one day. Patient was given an injection of Toradol in the emergency department. He was discharged with oral Toradol. Patient was also  discharged with amoxicillin. He was advised to seek care with a dentist immediately. All patient questions were answered. ____________________________________________  FINAL CLINICAL IMPRESSION(S) / ED DIAGNOSES  Final diagnoses:  Pain, dental      NEW MEDICATIONS STARTED DURING THIS VISIT:  New Prescriptions   AMOXICILLIN (AMOXIL) 500 MG TABLET    Take 1 tablet (500 mg total) by mouth 3 (three) times daily.   KETOROLAC (TORADOL) 10 MG TABLET    Take 1 tablet (10 mg total) by mouth every 6 (six) hours as needed.        This chart was dictated using voice recognition software/Dragon. Despite best efforts to proofread, errors can occur which can change the meaning. Any change was purely unintentional.    Orvil Feil, PA-C 04/15/17 2123    Sharman Cheek, MD 04/18/17 1029

## 2017-04-15 NOTE — Discharge Instructions (Signed)
OPTIONS FOR DENTAL FOLLOW UP CARE ° °[place] Department of Health and Human Services - Local Safety Net Dental Clinics °http://www.ncdhhs.gov/dph/oralhealth/services/safetynetclinics.htm °  °[facility] (336-562-3123) ° °[facility] ([phone]) ° °[facility] ([phone] ext [phone]) ° °[facility] ([phone]) ° °[facility] ([phone]) °This clinic caters to the indigent population and is on a lottery system. °Location: °[facility], [address], [place] °Clinic Hours: °Wednesdays from 6pm - 9pm, patients seen by a lottery system. °For dates, call or go to [email or web address] °Services: °Cleanings, fillings and simple extractions. °Payment Options: °DENTAL WORK IS FREE OF CHARGE. Bring proof of income or support. °Best way to get seen: °Arrive at 5:15 pm - this is a lottery, NOT first come/first serve, so arriving earlier will not increase your chances of being seen. °  °  °[facility] °[phone] °Select option 1 for emergencies °  °Location: °[facility], [address], [place] °Clinic Hours: °No walk-ins accepted - call the day before to schedule an appointment. °Check in times are 9:30 am and 1:30 pm. °Services: °Simple extractions, temporary fillings, pulpectomy/pulp debridement, uncomplicated abscess drainage. °Payment Options: °PAYMENT IS DUE AT THE TIME OF SERVICE.  Fee is usually $100-200, additional surgical procedures (e.g. abscess drainage) may be extra. °Cash, checks, Visa/MasterCard accepted.  Can file Medicaid if patient is covered for dental - patient should call case worker to check. °No discount for [facility] patients. °Best way to get seen: °MUST call the day before and get onto the schedule. Can usually be seen the next 1-2 days. No walk-ins accepted. °  °  °[facility] °[phone] °   °Location: °[facility], [address], [place] °Clinic Hours: °M, W, Th, F 8am or 1:30pm, Tues 9a or 1:30 - first come/first served. °Services: °Simple extractions, temporary fillings, uncomplicated abscess drainage.  You do not need to be an [place] resident. °Payment Options: °PAYMENT IS DUE AT THE TIME OF SERVICE. °Dental insurance, otherwise sliding scale - bring proof of income or support. °Depending on income and treatment needed, cost is usually $50-200. °Best way to get seen: °Arrive early as it is first come/first served. °  °  °[facility] °[phone] °  °Location: °[address] °Clinic Hours: °Mon-Thu 8a-5p °Services: °Most basic dental services including extractions and fillings. °Payment Options: °PAYMENT IS DUE AT THE TIME OF SERVICE. °Sliding scale, up to 50% off - bring proof if income or support. °Medicaid with dental option accepted. °Best way to get seen: °Call to schedule an appointment, can usually be seen within 2 weeks OR they will try to see walk-ins - show up at 8a or 2p (you may have to wait). °  °  °[facility] °919-245-2435 °[place] RESIDENTS ONLY °  °Location: °[facility], [address], [place], [place] [address] °Clinic Hours: By appointment only. °Monday - Thursday 8am-5pm, Friday 8am-12pm °Services: Cleanings, fillings, extractions. °Payment Options: °PAYMENT IS DUE AT THE TIME OF SERVICE. °Cash, Visa or MasterCard. Sliding scale - $30 minimum per service. °Best way to get seen: °Come in to office, complete packet and make an appointment - need proof of income °or support monies for each household member and proof of [place] residence. °Usually takes about a month to get in. °  °  °[facility] °[phone] °  °Location: °[address]   [place] °Clinic Hours: Walk-in Urgent Care Dental Services are offered Monday-Friday  mornings only. °The numbers of emergencies accepted daily is limited to the number of °providers available. °Maximum 15 - Mondays, Wednesdays & Thursdays °Maximum 10 - Tuesdays & Fridays °Services: °You do not need to be a [place] resident to be seen for a dental emergency. °Emergencies are defined as pain, swelling, abnormal bleeding, or dental trauma. Walkins will receive x-rays if needed. °NOTE: Dental cleaning is not an emergency. °Payment Options: °PAYMENT IS DUE AT THE TIME OF SERVICE. °Minimum co-pay is $40.00 for uninsured patients. °Minimum co-pay is $3.00 for Medicaid with dental coverage. °Dental Insurance is accepted and must be presented at time of visit. °Medicare does not cover dental. °Forms of payment: Cash, credit card, checks. °Best way to get seen: °If not previously registered with the clinic, walk-in dental registration begins at 7:15 am and is on a first come/first serve basis. °If previously registered with the clinic, call to make an appointment. °  °  °[facility] °[phone] °[place] RESIDENTS ONLY °  °Location: °[address], [place], [place] °Clinic Hours: °Mon-Thu 10a-2p °Services: Extractions only! °Payment Options: °FREE (donations accepted) - bring proof of income or support °Best way to get seen: °Call and schedule an appointment OR come at 8am on the 1st Monday of every month (except for holidays) when it is first come/first served. °  °  °[facility] °[phone] °  °Location: °[address], [place] °Clinic Hours: °Friday mornings °Services, Payment Options, Best way to get seen: °Call for info °

## 2018-04-02 ENCOUNTER — Ambulatory Visit: Payer: Self-pay

## 2018-04-08 ENCOUNTER — Other Ambulatory Visit: Payer: Self-pay

## 2018-04-08 ENCOUNTER — Encounter: Payer: Self-pay | Admitting: Emergency Medicine

## 2018-04-08 ENCOUNTER — Emergency Department
Admission: EM | Admit: 2018-04-08 | Discharge: 2018-04-08 | Disposition: A | Discharge status: Home / Self Care | Payer: Self-pay | Attending: Emergency Medicine | Admitting: Emergency Medicine

## 2018-04-08 DIAGNOSIS — F17210 Nicotine dependence, cigarettes, uncomplicated: Secondary | ICD-10-CM | POA: Insufficient documentation

## 2018-04-08 DIAGNOSIS — K0889 Other specified disorders of teeth and supporting structures: Secondary | ICD-10-CM | POA: Insufficient documentation

## 2018-04-08 DIAGNOSIS — Z79899 Other long term (current) drug therapy: Secondary | ICD-10-CM | POA: Insufficient documentation

## 2018-04-08 MED ORDER — KETOROLAC TROMETHAMINE 10 MG PO TABS: 10.0000 mg | ORAL_TABLET | Freq: Four times a day (QID) | ORAL | 0 refills | Status: AC | PRN

## 2018-04-08 MED ORDER — KETOROLAC TROMETHAMINE 30 MG/ML IJ SOLN
30.0000 mg | Freq: Once | INTRAMUSCULAR | Status: AC
  Administered 2018-04-08: 30 mg via INTRAMUSCULAR
  Filled 2018-04-08: qty 1

## 2018-04-08 NOTE — ED Triage Notes (Signed)
C/O left sided dental pain, for weeks.  Pain initially started on left lower jaw, now upper and lower jaw hurt as well as left ear.

## 2018-04-08 NOTE — Discharge Instructions (Signed)
OPTIONS FOR DENTAL FOLLOW UP CARE ° °[place] Department of Health and Human Services - Local Safety Net Dental Clinics °http://www.ncdhhs.gov/dph/oralhealth/services/safetynetclinics.htm °  °[facility] ([phone]) ° °[facility] ([phone]) ° °[facility] ([phone] ext [phone]) ° °[facility] ([phone]) ° °[facility] ([phone]) °This clinic caters to the indigent population and is on a lottery system. °Location: °[facility], [address], [place] °Clinic Hours: °Wednesdays from 6pm - 9pm, patients seen by a lottery system. °For dates, call or go to [email or web address] °Services: °Cleanings, fillings and simple extractions. °Payment Options: °DENTAL WORK IS FREE OF CHARGE. Bring proof of income or support. °Best way to get seen: °Arrive at 5:15 pm - this is a lottery, NOT first come/first serve, so arriving earlier will not increase your chances of being seen. °  °  °[facility] °[phone] °Select option 1 for emergencies °  °Location: °[facility], [address], [place] °Clinic Hours: °No walk-ins accepted - call the day before to schedule an appointment. °Check in times are 9:30 am and 1:30 pm. °Services: °Simple extractions, temporary fillings, pulpectomy/pulp debridement, uncomplicated abscess drainage. °Payment Options: °PAYMENT IS DUE AT THE TIME OF SERVICE.  Fee is usually $100-200, additional surgical procedures (e.g. abscess drainage) may be extra. °Cash, checks, Visa/MasterCard accepted.  Can file Medicaid if patient is covered for dental - patient should call case worker to check. °No discount for [facility] patients. °Best way to get seen: °MUST call the day before and get onto the schedule. Can usually be seen the next 1-2 days. No walk-ins accepted. °  °  °[facility] °[phone] °   °Location: °[facility], [address], [place] °Clinic Hours: °M, W, Th, F 8am or 1:30pm, Tues 9a or 1:30 - first come/first served. °Services: °Simple extractions, temporary fillings, uncomplicated abscess drainage.  You do not need to be an [place] resident. °Payment Options: °PAYMENT IS DUE AT THE TIME OF SERVICE. °Dental insurance, otherwise sliding scale - bring proof of income or support. °Depending on income and treatment needed, cost is usually $50-200. °Best way to get seen: °Arrive early as it is first come/first served. °  °  °[facility] °[phone] °  °Location: °[address] °Clinic Hours: °Mon-Thu 8a-5p °Services: °Most basic dental services including extractions and fillings. °Payment Options: °PAYMENT IS DUE AT THE TIME OF SERVICE. °Sliding scale, up to 50% off - bring proof if income or support. °Medicaid with dental option accepted. °Best way to get seen: °Call to schedule an appointment, can usually be seen within 2 weeks OR they will try to see walk-ins - show up at 8a or 2p (you may have to wait). °  °  °[facility] °[phone] °[place] RESIDENTS ONLY °  °Location: °[facility], [address], [place], [place] [address] °Clinic Hours: By appointment only. °Monday - Thursday 8am-5pm, Friday 8am-12pm °Services: Cleanings, fillings, extractions. °Payment Options: °PAYMENT IS DUE AT THE TIME OF SERVICE. °Cash, Visa or MasterCard. Sliding scale - $30 minimum per service. °Best way to get seen: °Come in to office, complete packet and make an appointment - need proof of income °or support monies for each household member and proof of [place] residence. °Usually takes about a month to get in. °  °  °[facility] °[phone] °  °Location: °[address]   [place] °Clinic Hours: Walk-in Urgent Care Dental Services are offered Monday-Friday  mornings only. °The numbers of emergencies accepted daily is limited to the number of °providers available. °Maximum 15 - Mondays, Wednesdays & Thursdays °Maximum 10 - Tuesdays & Fridays °Services: °You do not need to be a [place] resident to be seen for a dental emergency. °Emergencies are defined as pain, swelling, abnormal bleeding, or dental trauma. Walkins will receive x-rays if needed. °NOTE: Dental cleaning is not an emergency. °Payment Options: °PAYMENT IS DUE AT THE TIME OF SERVICE. °Minimum co-pay is $40.00 for uninsured patients. °Minimum co-pay is $3.00 for Medicaid with dental coverage. °Dental Insurance is accepted and must be presented at time of visit. °Medicare does not cover dental. °Forms of payment: Cash, credit card, checks. °Best way to get seen: °If not previously registered with the clinic, walk-in dental registration begins at 7:15 am and is on a first come/first serve basis. °If previously registered with the clinic, call to make an appointment. °  °  °[facility] °[phone] °[place] RESIDENTS ONLY °  °Location: °[address], [place],  °Clinic Hours: °Mon-Thu 10a-2p °Services: Extractions only! °Payment Options: °FREE (donations accepted) - bring proof of income or support °Best way to get seen: °Call and schedule an appointment OR come at 8am on the 1st Monday of every month (except for holidays) when it is first come/first served. °  °  °[facility] °[phone] °  °Location: °[address], [place] °Clinic Hours: °Friday mornings °Services, Payment Options, Best way to get seen: °Call for info °

## 2018-04-08 NOTE — ED Provider Notes (Signed)
[facility] Emergency Department Provider Note  ____________________________________________  Time seen: Approximately 9:49 PM  I have reviewed the triage vital signs and the nursing notes.   HISTORY  Chief Complaint Dental Pain    HPI Brett Rice is a 27 y.o. male presents to the emergency department with 10 out of 10  inferior 18 pain for the past 3 weeks.  Patient reports that he has had no success salvaging an appointment with a local dentist.  He denies edema of the upper or lower jaw.  No alleviating measures have been attempted.   History reviewed. No pertinent past medical history.  There are no active problems to display for this patient.   History reviewed. No pertinent surgical history.  Prior to Admission medications   Medication Sig Start Date End Date Taking? Authorizing Provider  brompheniramine-pseudoephedrine-DM 30-2-10 MG/5ML syrup Take 5 mLs by mouth 4 (four) times daily as needed. 03/16/16   Joni ReiningSmith, Ronald K, PA-C  clonazePAM (KLONOPIN) 0.5 MG tablet Take 0.5 tablets (0.25 mg total) by mouth 2 (two) times daily as needed for anxiety. 07/12/16   Phineas SemenGoodman, Graydon, MD  famotidine (PEPCID) 40 MG tablet Take 1 tablet (40 mg total) by mouth every evening. 02/02/16 02/01/17  Myrna BlazerSchaevitz, David Matthew, MD  HYDROcodone-acetaminophen (NORCO/VICODIN) 5-325 MG tablet Take 1 tablet by mouth every 4 (four) hours as needed for moderate pain. 04/14/16   Tommi RumpsSummers, Rhonda L, PA-C  ibuprofen (ADVIL,MOTRIN) 600 MG tablet Take 1 tablet (600 mg total) by mouth every 8 (eight) hours as needed. 04/14/16   Tommi RumpsSummers, Rhonda L, PA-C  ketorolac (TORADOL) 10 MG tablet Take 1 tablet (10 mg total) by mouth every 6 (six) hours as needed for up to 5 days. 04/08/18 04/13/18  Orvil FeilWoods, Cayden Rautio M, PA-C  metoCLOPramide (REGLAN) 10 MG tablet Take 1 tablet (10 mg total) by mouth every 6 (six) hours as needed for nausea or vomiting. 02/02/16   Myrna BlazerSchaevitz, David Matthew, MD     Allergies Risperdal [risperidone]  No family history on file.  Social History Social History   Tobacco Use  . Smoking status: Current Every Day Smoker    Packs/day: 2.00    Types: Cigarettes  . Smokeless tobacco: Never Used  Substance Use Topics  . Alcohol use: Yes    Comment: social   . Drug use: Yes    Types: Marijuana    Comment: 1-2x per month     Review of Systems  Constitutional: No fever/chills Eyes: No visual changes. No discharge ENT: No upper respiratory complaints. Patient has dental pain.  Cardiovascular: no chest pain. Respiratory: no cough. No SOB. Gastrointestinal: No abdominal pain.  No nausea, no vomiting.  No diarrhea.  No constipation. Musculoskeletal: Negative for musculoskeletal pain. Skin: Negative for rash, abrasions, lacerations, ecchymosis. Neurological: Negative for headaches, focal weakness or numbness.   ____________________________________________   PHYSICAL EXAM:  VITAL SIGNS: ED Triage Vitals  Enc Vitals Group     BP 04/08/18 1852 114/74     Pulse Rate 04/08/18 1852 (!) 101     Resp 04/08/18 1852 16     Temp 04/08/18 1852 98.6 F (37 C)     Temp Source 04/08/18 1852 Oral     SpO2 04/08/18 1852 98 %     Weight 04/08/18 1851 164 lb (74.4 kg)     Height 04/08/18 1851 5\' 10"  (1.778 m)     Head Circumference --      Peak Flow --      Pain Score  04/08/18 1851 10     Pain Loc --      Pain Edu? --      Excl. in GC? --      Constitutional: Alert and oriented. Well appearing and in no acute distress. Eyes: Conjunctivae are normal. PERRL. EOMI. Head: Atraumatic. ENT:      Ears: TMs are pearly.      Nose: No congestion/rhinnorhea.      Mouth/Throat: Mucous membranes are moist.  Patient has broken inferior 18. Cardiovascular: Normal rate, regular rhythm. Normal S1 and S2.  Good peripheral circulation. Respiratory: Normal respiratory effort without tachypnea or retractions. Lungs CTAB. Good air entry to the bases with no  decreased or absent breath sounds. Musculoskeletal: Full range of motion to all extremities. No gross deformities appreciated. Neurologic:  Normal speech and language. No gross focal neurologic deficits are appreciated.  Skin:  Skin is warm, dry and intact. No rash noted. ____________________________________________   LABS (all labs ordered are listed, but only abnormal results are displayed)  Labs Reviewed - No data to display ____________________________________________  EKG   ____________________________________________  RADIOLOGY   No results found.  ____________________________________________    PROCEDURES  Procedure(s) performed:    Procedures    Medications  ketorolac (TORADOL) 30 MG/ML injection 30 mg (30 mg Intramuscular Given 04/08/18 2122)     ____________________________________________   INITIAL IMPRESSION / ASSESSMENT AND PLAN / ED COURSE  Pertinent labs & imaging results that were available during my care of the patient were reviewed by me and considered in my medical decision making (see chart for details).  Review of the [place] CSRS was performed in accordance of the [facility] prior to dispensing any controlled drugs.     Assessment and plan Dental pain Patient presents to the emergency department with chronic dental pain.  Patient was given an injection of Toradol in the emergency department and discharged with Toradol by mouth.  Patient reported that he had gone to [facility] 2 days ago and they prescribed amoxicillin and naproxen.  Patient was advised to make an appointment with a local dentist as soon as possible.  All patient questions were answered.   ____________________________________________  FINAL CLINICAL IMPRESSION(S) / ED DIAGNOSES  Final diagnoses:  Pain, dental      NEW MEDICATIONS STARTED DURING THIS VISIT:  ED Discharge Orders        Ordered    ketorolac (TORADOL) 10 MG tablet  Every 6 hours PRN     04/08/18 2118           This chart was dictated using voice recognition software/Dragon. Despite best efforts to proofread, errors can occur which can change the meaning. Any change was purely unintentional.    Gasper Lloyd 04/08/18 2234    Myrna Blazer, MD 04/09/18 1115

## 2018-04-08 NOTE — ED Notes (Signed)
See triage note  Presents with dental pain which has been the for couple of weeks or longer  Now pain is moving up into ear

## 2018-04-11 ENCOUNTER — Ambulatory Visit: Payer: Self-pay | Admitting: Family Medicine

## 2018-04-11 VITALS — BP 135/82 | HR 87 | Temp 97.9°F | Resp 18 | Wt 164.0 lb

## 2018-04-11 DIAGNOSIS — K625 Hemorrhage of anus and rectum: Secondary | ICD-10-CM

## 2018-04-11 DIAGNOSIS — K6289 Other specified diseases of anus and rectum: Secondary | ICD-10-CM

## 2018-04-11 DIAGNOSIS — R21 Rash and other nonspecific skin eruption: Secondary | ICD-10-CM

## 2018-04-11 DIAGNOSIS — F329 Major depressive disorder, single episode, unspecified: Secondary | ICD-10-CM

## 2018-04-11 DIAGNOSIS — Z09 Encounter for follow-up examination after completed treatment for conditions other than malignant neoplasm: Secondary | ICD-10-CM

## 2018-04-11 DIAGNOSIS — K0889 Other specified disorders of teeth and supporting structures: Secondary | ICD-10-CM

## 2018-04-11 DIAGNOSIS — F32A Depression, unspecified: Secondary | ICD-10-CM

## 2018-04-11 DIAGNOSIS — F419 Anxiety disorder, unspecified: Secondary | ICD-10-CM

## 2018-04-11 MED ORDER — METOCLOPRAMIDE HCL 10 MG PO TABS: 10.0000 mg | ORAL_TABLET | Freq: Four times a day (QID) | ORAL | 0 refills | Status: DC | PRN

## 2018-04-11 MED ORDER — IBUPROFEN 600 MG PO TABS: 600.0000 mg | ORAL_TABLET | Freq: Three times a day (TID) | ORAL | 0 refills | Status: DC | PRN

## 2018-04-11 MED ORDER — FAMOTIDINE 40 MG PO TABS: 40.0000 mg | ORAL_TABLET | Freq: Every evening | ORAL | 0 refills | Status: DC
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
## 2018-04-13 MED ORDER — METOCLOPRAMIDE HCL 10 MG PO TABS: 10.0000 mg | ORAL_TABLET | Freq: Four times a day (QID) | ORAL | 0 refills | Status: DC | PRN

## 2018-04-13 MED ORDER — IBUPROFEN 600 MG PO TABS: 600.0000 mg | ORAL_TABLET | Freq: Three times a day (TID) | ORAL | 0 refills | Status: DC | PRN

## 2018-04-13 MED ORDER — FAMOTIDINE 40 MG PO TABS: 40.0000 mg | ORAL_TABLET | Freq: Every evening | ORAL | 0 refills | Status: DC

## 2018-04-16 ENCOUNTER — Other Ambulatory Visit: Payer: Self-pay

## 2018-04-18 ENCOUNTER — Other Ambulatory Visit: Payer: Self-pay

## 2018-04-18 DIAGNOSIS — Z09 Encounter for follow-up examination after completed treatment for conditions other than malignant neoplasm: Secondary | ICD-10-CM

## 2018-04-19 LAB — COMPREHENSIVE METABOLIC PANEL
A/G RATIO: 2.1 (ref 1.2–2.2)
ALK PHOS: 71 IU/L (ref 39–117)
ALT: 11 IU/L (ref 0–44)
AST: 9 IU/L (ref 0–40)
Albumin: 3.8 g/dL (ref 3.5–5.5)
BILIRUBIN TOTAL: 0.2 mg/dL (ref 0.0–1.2)
BUN / CREAT RATIO: 15 (ref 9–20)
BUN: 13 mg/dL (ref 6–20)
CO2: 22 mmol/L (ref 20–29)
Calcium: 8.9 mg/dL (ref 8.7–10.2)
Chloride: 107 mmol/L — ABNORMAL HIGH (ref 96–106)
Creatinine, Ser: 0.86 mg/dL (ref 0.76–1.27)
GFR calc Af Amer: 140 mL/min/{1.73_m2} (ref >59)
GFR calc non Af Amer: 121 mL/min/{1.73_m2} (ref >59)
GLUCOSE: 136 mg/dL — AB (ref 65–99)
Globulin, Total: 1.8 g/dL (ref 1.5–4.5)
POTASSIUM: 4.1 mmol/L (ref 3.5–5.2)
Sodium: 143 mmol/L (ref 134–144)
Total Protein: 5.6 g/dL — ABNORMAL LOW (ref 6.0–8.5)

## 2018-04-19 LAB — CBC WITH DIFFERENTIAL
BASOS ABS: 0 10*3/uL (ref 0.0–0.2)
Basos: 0 %
EOS (ABSOLUTE): 0.2 10*3/uL (ref 0.0–0.4)
EOS: 2 %
HEMATOCRIT: 43.2 % (ref 37.5–51.0)
Hemoglobin: 15.3 g/dL (ref 13.0–17.7)
Immature Grans (Abs): 0.1 10*3/uL (ref 0.0–0.1)
Immature Granulocytes: 1 %
Lymphocytes Absolute: 2.7 10*3/uL (ref 0.7–3.1)
Lymphs: 27 %
MCH: 30.7 pg (ref 26.6–33.0)
MCHC: 35.4 g/dL (ref 31.5–35.7)
MCV: 87 fL (ref 79–97)
MONOS ABS: 0.5 10*3/uL (ref 0.1–0.9)
Monocytes: 5 %
NEUTROS PCT: 65 %
Neutrophils Absolute: 6.4 10*3/uL (ref 1.4–7.0)
RBC: 4.99 x10E6/uL (ref 4.14–5.80)
RDW: 14.1 % (ref 12.3–15.4)
WBC: 9.9 10*3/uL (ref 3.4–10.8)

## 2018-04-19 LAB — URINALYSIS, ROUTINE W REFLEX MICROSCOPIC
BILIRUBIN UA: NEGATIVE
GLUCOSE, UA: NEGATIVE
Ketones, UA: NEGATIVE
Leukocytes, UA: NEGATIVE
Nitrite, UA: NEGATIVE
PROTEIN UA: NEGATIVE
RBC UA: NEGATIVE
SPEC GRAV UA: 1.019 (ref 1.005–1.030)
UUROB: 1 mg/dL (ref 0.2–1.0)
pH, UA: 5 (ref 5.0–7.5)

## 2018-04-23 ENCOUNTER — Ambulatory Visit: Payer: Self-pay | Admitting: Adult Health Nurse Practitioner

## 2018-04-23 DIAGNOSIS — R739 Hyperglycemia, unspecified: Secondary | ICD-10-CM | POA: Insufficient documentation

## 2018-04-23 DIAGNOSIS — L409 Psoriasis, unspecified: Secondary | ICD-10-CM | POA: Insufficient documentation

## 2018-04-23 DIAGNOSIS — K0889 Other specified disorders of teeth and supporting structures: Secondary | ICD-10-CM

## 2018-04-23 NOTE — Addendum Note (Signed)
Addended by: Charlene Brooke on: 04/23/2018 07:23 PM   Modules accepted: Orders

## 2018-04-23 NOTE — Progress Notes (Addendum)
   Subjective:    Patient ID: Brett Rice, male    DOB: 22-Feb-1991, 27 y.o.   MRN: [id number]  HPI  Brett Rice is a 27 yo male here for severe dental pain. He has a dental apt on May 7 but he reports he "can't wait that long" Pt has family history of diabetes with mother and father.   There are no active problems to display for this patient.  Allergies as of 04/23/2018      Reactions   Risperdal [risperidone] Other (See Comments)   Agitation   Zoloft [sertraline Hcl] Itching      Medication List        Accurate as of 04/23/18  6:57 PM. Always use your most recent med list.          brompheniramine-pseudoephedrine-DM 30-2-10 MG/5ML syrup Take 5 mLs by mouth 4 (four) times daily as needed.   clonazePAM 0.5 MG tablet Commonly known as:  KLONOPIN Take 0.5 tablets (0.25 mg total) by mouth 2 (two) times daily as needed for anxiety.   HYDROcodone-acetaminophen 5-325 MG tablet Commonly known as:  NORCO/VICODIN Take 1 tablet by mouth every 4 (four) hours as needed for moderate pain.   ibuprofen 600 MG tablet Commonly known as:  ADVIL,MOTRIN Take 1 tablet (600 mg total) by mouth every 8 (eight) hours as needed.   metoCLOPramide 10 MG tablet Commonly known as:  REGLAN Take 1 tablet (10 mg total) by mouth every 6 (six) hours as needed for nausea or vomiting.        Review of Systems  All other systems reviewed and are negative.       Objective:   Physical Exam  Constitutional: He is oriented to person, place, and time. He appears well-developed and well-nourished.  Cardiovascular: Normal rate, regular rhythm and normal heart sounds.  Pulmonary/Chest: Effort normal and breath sounds normal.  Neurological: He is alert and oriented to person, place, and time.  Vitals reviewed.   BP 110/73   Pulse 88   Temp 98.1 F (36.7 C)   Wt 159 lb 3.2 oz (72.2 kg)   BMI 22.84 kg/m        Assessment & Plan:   F/u with dental clinic on May 7 for extraction.   POCT  A1C due to elevated glucose of 136 on last labs. A1c 5.2.   iFOBT given.   Dermatology referral for Psoriasis.

## 2018-05-14 ENCOUNTER — Telehealth: Payer: Self-pay

## 2018-05-14 NOTE — Telephone Encounter (Signed)
Faxed referral to [facility]

## 2018-05-21 ENCOUNTER — Telehealth: Payer: Self-pay

## 2018-05-21 NOTE — Telephone Encounter (Signed)
Informed pt of appt at [facility]. Scheduled on 6/17 at 230p with Dr Gwen Pounds

## 2019-05-18 ENCOUNTER — Emergency Department: Payer: HRSA Program

## 2019-05-18 ENCOUNTER — Encounter: Payer: Self-pay | Admitting: Emergency Medicine

## 2019-05-18 ENCOUNTER — Emergency Department
Admission: EM | Admit: 2019-05-18 | Discharge: 2019-05-19 | Disposition: A | Discharge status: Home / Self Care | Payer: HRSA Program | Attending: Emergency Medicine | Admitting: Emergency Medicine

## 2019-05-18 ENCOUNTER — Other Ambulatory Visit: Payer: Self-pay

## 2019-05-18 DIAGNOSIS — K529 Noninfective gastroenteritis and colitis, unspecified: Secondary | ICD-10-CM

## 2019-05-18 DIAGNOSIS — F17210 Nicotine dependence, cigarettes, uncomplicated: Secondary | ICD-10-CM | POA: Diagnosis not present

## 2019-05-18 DIAGNOSIS — R112 Nausea with vomiting, unspecified: Secondary | ICD-10-CM | POA: Diagnosis present

## 2019-05-18 DIAGNOSIS — Z20828 Contact with and (suspected) exposure to other viral communicable diseases: Secondary | ICD-10-CM | POA: Insufficient documentation

## 2019-05-18 MED ORDER — KETOROLAC TROMETHAMINE 30 MG/ML IJ SOLN
30.0000 mg | Freq: Once | INTRAMUSCULAR | Status: AC
  Administered 2019-05-18: 30 mg via INTRAMUSCULAR
  Filled 2019-05-18: qty 1

## 2019-05-18 MED ORDER — SODIUM CHLORIDE 0.9% FLUSH: 3.0000 mL | Freq: Once | INTRAVENOUS | Status: DC

## 2019-05-18 NOTE — ED Triage Notes (Addendum)
Pt reports he started feeling bad Friday with left earache, N/V/D; pt says he had to work all weekend and just had to leave work feeling bad; pt adds deep breath with deep inspiration

## 2019-05-18 NOTE — ED Provider Notes (Signed)
[facility] Emergency Department Provider Note   ____________________________________________    I have reviewed the triage vital signs and the nursing notes.   HISTORY  Chief Complaint Otalgia; Nausea; Emesis; Diarrhea; and Chest Pain     HPI [name] is a 28 y.o. male who presents with complaints of nausea vomiting and diarrhea for about 48 hours.  However he denies abdominal pain.  he notes today he has felt some chest tightness and occasional chills, has checked his temperature and found it to be normal.  No known exposure to COVID-19 patients.  No recent travel.  No shortness of breath, occasional cough noted.  Has not taken anything for this.  Complains of some tightness in the left side of his neck as well and discomfort, no difficulty swallowing or breathing  Past Medical History:  Diagnosis Date  . Allergy   . Anxiety   . Depression     Patient Active Problem List   Diagnosis Date Noted  . Elevated serum glucose 04/23/2018  . Tooth pain 04/23/2018  . Psoriasis 04/23/2018    History reviewed. No pertinent surgical history.  Prior to Admission medications   Medication Sig Start Date End Date Taking? Authorizing Provider  ibuprofen (ADVIL,MOTRIN) 600 MG tablet Take 1 tablet (600 mg total) by mouth every 8 (eight) hours as needed. 04/13/18   Kallie LocksStroud, Natalie M, FNP  ondansetron (ZOFRAN ODT) 4 MG disintegrating tablet Take 1 tablet (4 mg total) by mouth every 8 (eight) hours as needed. 05/19/19   Jene EveryKinner, Mattea Seger, MD     Allergies Risperdal [risperidone] and Zoloft [sertraline hcl]  Family History  Problem Relation Age of Onset  . Diabetes Mother   . Schizophrenia Mother   . Hypertension Mother   . Diabetes Father     Social History Social History   Tobacco Use  . Smoking status: Current Every Day Smoker    Packs/day: 4.00    Years: 11.00    Pack years: 44.00    Types: Cigarettes  . Smokeless tobacco: Never Used  .  Tobacco comment: Started age 28  Substance Use Topics  . Alcohol use: Not Currently    Alcohol/week: 14.0 standard drinks    Types: 14 Glasses of wine per week    Comment: social   . Drug use: Not Currently    Types: Marijuana, Benzodiazepines    Review of Systems  Constitutional: No fever Eyes: No visual changes.  ENT: As above Cardiovascular: As above Respiratory as above Gastrointestinal: As above Genitourinary: Negative for dysuria. Musculoskeletal: Negative for back pain. Skin: Negative for rash. Neurological: Negative for headaches or weakness   ____________________________________________   PHYSICAL EXAM:  VITAL SIGNS: ED Triage Vitals  Enc Vitals Group     BP --      Pulse Rate 05/18/19 2237 79     Resp 05/18/19 2237 16     Temp 05/18/19 2237 98.9 F (37.2 C)     Temp Source 05/18/19 2237 Oral     SpO2 05/18/19 2237 98 %     Weight 05/18/19 2240 73.5 kg (162 lb)     Height 05/18/19 2240 1.791 m (5' 10.5")     Head Circumference --      Peak Flow --      Pain Score 05/18/19 2239 10     Pain Loc --      Pain Edu? --      Excl. in GC? --     Constitutional: Alert  and oriented.  Eyes: Conjunctivae are normal.   Nose: No congestion/rhinnorhea. Mouth/Throat: Mucous membranes are moist.   Neck: Tenderness palpation over the left trapezius insertion, no swelling or redness or infection, positive anterior cervical lymphadenopathy Cardiovascular: Normal rate, regular rhythm. Grossly normal heart sounds.  Good peripheral circulation. Respiratory: Normal respiratory effort.  No retractions.  Gastrointestinal: Soft and nontender. No distention.  No CVA tenderness.  Musculoskeletal:  Warm and well perfused Neurologic:  Normal speech and language. No gross focal neurologic deficits are appreciated.  Skin:  Skin is warm, dry and intact. No rash noted. Psychiatric: Mood and affect are normal. Speech and behavior are normal.   ____________________________________________   LABS (all labs ordered are listed, but only abnormal results are displayed)  Labs Reviewed  NOVEL CORONAVIRUS, NAA (HOSPITAL ORDER, SEND-OUT TO REF LAB)   ____________________________________________  EKG  None ____________________________________________  RADIOLOGY  Chest x-ray ____________________________________________   PROCEDURES  Procedure(s) performed: No  Procedures   Critical Care performed: No ____________________________________________   INITIAL IMPRESSION / ASSESSMENT AND PLAN / ED COURSE  Pertinent labs & imaging results that were available during my care of the patient were reviewed by me and considered in my medical decision making (see chart for details).  Patient presents with nausea vomiting, mild cough, positive lymphadenopathy on exam otherwise well-appearing reassuring abdominal exam.  Suspect viral illness, COVID-19 is certainly a possibility although unlikely given his symptoms.  We will obtain chest x-ray given description of chest tightness send COVID-19 swab and reevaluate  Chest x-ray overall reassuring, on reevaluation patient well-appearing and in no distress.  Will discharge with symptomatic treatment, outpatient follow-up as needed.      Brett Rice was evaluated in Emergency Department on 05/19/2019 for the symptoms described in the history of present illness. He was evaluated in the context of the global COVID-19 pandemic, which necessitated consideration that the patient might be at risk for infection with the SARS-CoV-2 virus that causes COVID-19. Institutional protocols and algorithms that pertain to the evaluation of patients at risk for COVID-19 are in a state of rapid change based on information released by regulatory bodies including the CDC and federal and state organizations. These policies and algorithms were followed during the patient's care in the ED.      ____________________________________________   FINAL CLINICAL IMPRESSION(S) / ED DIAGNOSES  Final diagnoses:  Gastroenteritis        Note:  This document was prepared using Dragon voice recognition software and may include unintentional dictation errors.   Jene Every, MD 05/19/19 [id number]

## 2019-05-19 MED ORDER — ONDANSETRON 4 MG PO TBDP: 4.0000 mg | ORAL_TABLET | Freq: Three times a day (TID) | ORAL | 0 refills | Status: DC | PRN

## 2019-05-20 LAB — NOVEL CORONAVIRUS, NAA (HOSP ORDER, SEND-OUT TO REF LAB; TAT 18-24 HRS): SARS-CoV-2, NAA: NOT DETECTED

## 2019-08-19 IMAGING — DX PORTABLE CHEST - 1 VIEW
1 series · 1 of 1 positions shown · non-contrast
Comparison: None.

CLINICAL DATA: Initial evaluation for acute cough.

EXAM:
PORTABLE CHEST 1 VIEW

[chest ap]
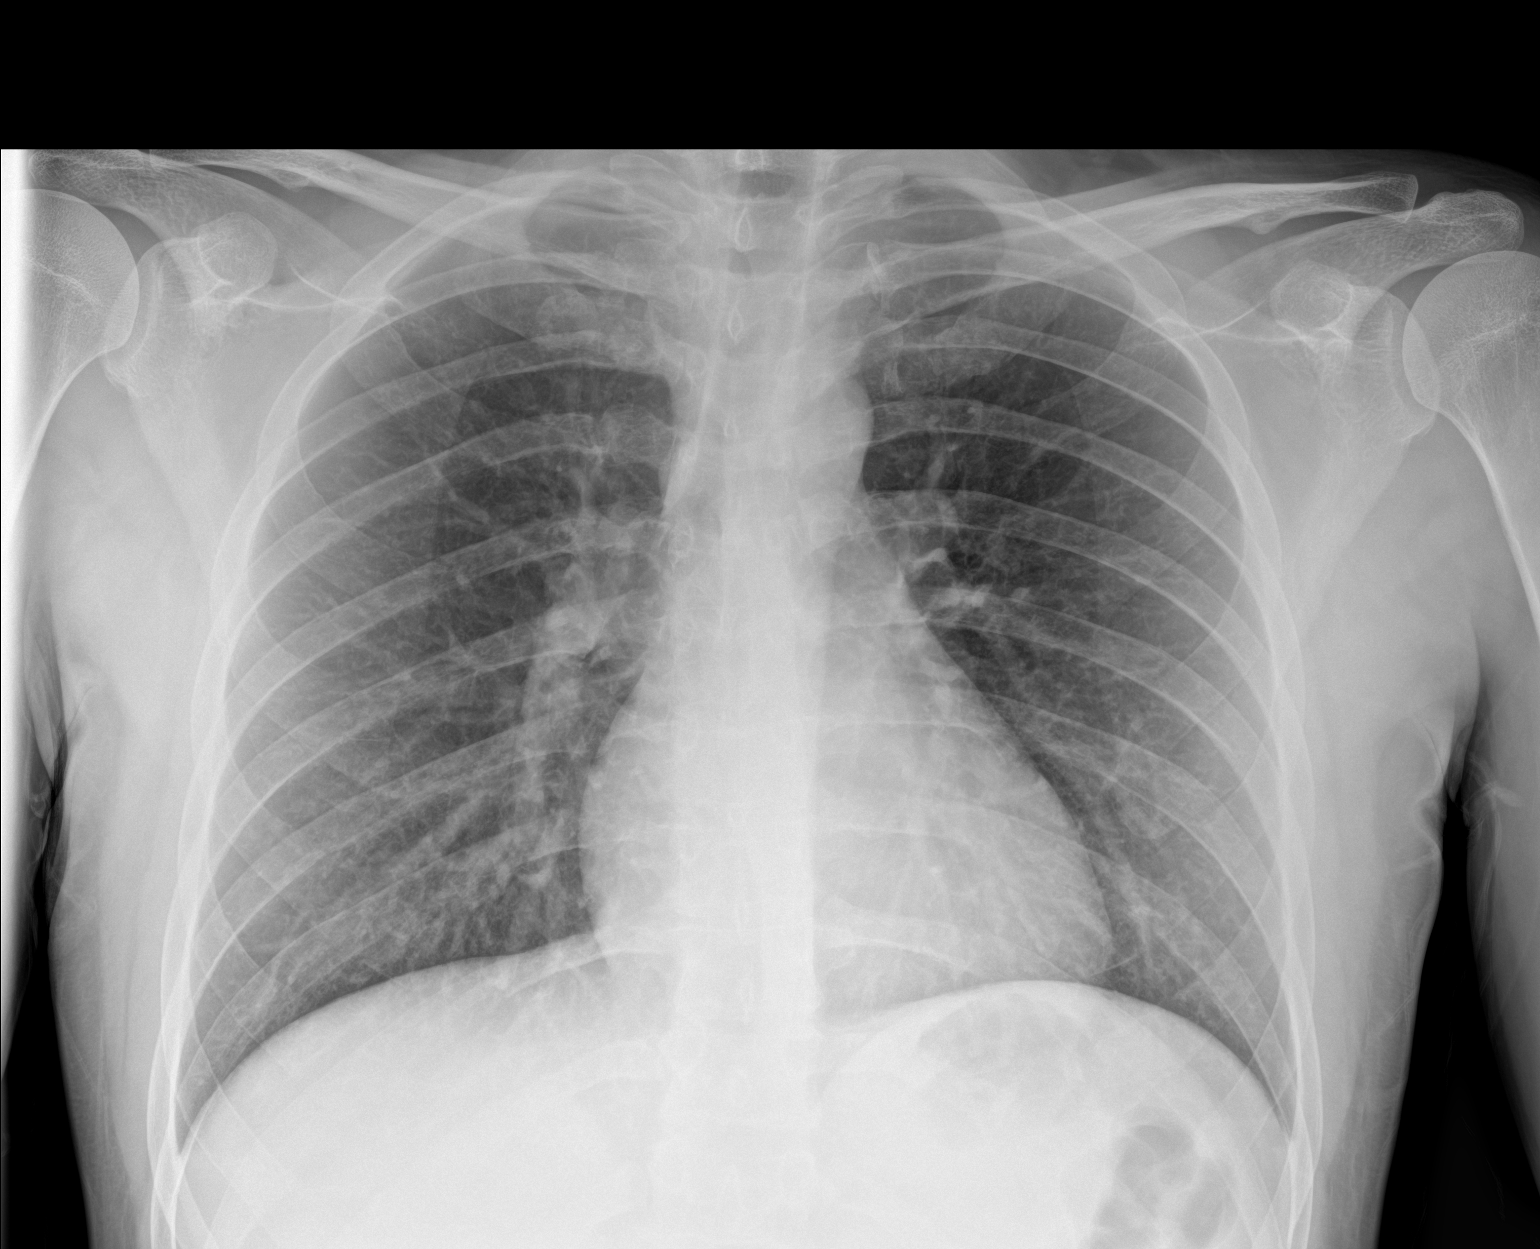

[1 of 1 positions shown; findings below may reference images not displayed]

FINDINGS: Cardiac and mediastinal silhouettes are within normal limits.

Lungs normally inflated. Mild scattered peribronchial thickening,
which could reflect acute bronchiolitis given provided history of
cough. No focal infiltrates to suggest bronchopneumonia. No edema or
effusion. No pneumothorax.

No acute osseous finding.
IMPRESSION: Mild scattered peribronchial thickening, which could reflect acute
bronchiolitis given provided history of cough. No focal infiltrates
to suggest bronchopneumonia.

## 2019-10-11 ENCOUNTER — Emergency Department: Payer: No Typology Code available for payment source

## 2019-10-11 ENCOUNTER — Encounter: Payer: Self-pay | Admitting: Emergency Medicine

## 2019-10-11 ENCOUNTER — Other Ambulatory Visit: Payer: Self-pay

## 2019-10-11 ENCOUNTER — Emergency Department
Admission: EM | Admit: 2019-10-11 | Discharge: 2019-10-11 | Disposition: A | Discharge status: Home / Self Care | Payer: No Typology Code available for payment source | Attending: Emergency Medicine | Admitting: Emergency Medicine

## 2019-10-11 DIAGNOSIS — M791 Myalgia, unspecified site: Secondary | ICD-10-CM | POA: Insufficient documentation

## 2019-10-11 DIAGNOSIS — F17210 Nicotine dependence, cigarettes, uncomplicated: Secondary | ICD-10-CM | POA: Insufficient documentation

## 2019-10-11 DIAGNOSIS — R0789 Other chest pain: Secondary | ICD-10-CM | POA: Insufficient documentation

## 2019-10-11 DIAGNOSIS — Y999 Unspecified external cause status: Secondary | ICD-10-CM | POA: Diagnosis not present

## 2019-10-11 DIAGNOSIS — M546 Pain in thoracic spine: Secondary | ICD-10-CM | POA: Diagnosis present

## 2019-10-11 DIAGNOSIS — Y92410 Unspecified street and highway as the place of occurrence of the external cause: Secondary | ICD-10-CM | POA: Diagnosis not present

## 2019-10-11 DIAGNOSIS — M7918 Myalgia, other site: Secondary | ICD-10-CM

## 2019-10-11 DIAGNOSIS — Y93I9 Activity, other involving external motion: Secondary | ICD-10-CM | POA: Insufficient documentation

## 2019-10-11 MED ORDER — IBUPROFEN 600 MG PO TABS: 600.0000 mg | ORAL_TABLET | Freq: Three times a day (TID) | ORAL | 0 refills | Status: AC | PRN

## 2019-10-11 MED ORDER — CYCLOBENZAPRINE HCL 10 MG PO TABS: 10.0000 mg | ORAL_TABLET | Freq: Three times a day (TID) | ORAL | 0 refills | Status: AC | PRN
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
## 2019-10-22 ENCOUNTER — Ambulatory Visit: Payer: Self-pay
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
## 2024-07-11 ENCOUNTER — Encounter: Payer: Self-pay | Admitting: Advanced Practice Midwife
